# Patient Record
Sex: Male | Born: 1960 | Race: White | Hispanic: No | Smoking: Former smoker
Health system: Southern US, Academic
[De-identification: ages and names within clinical notes are randomized; demographics above are authoritative.]

## PROBLEM LIST (undated history)

## (undated) DIAGNOSIS — N411 Chronic prostatitis: Secondary | ICD-10-CM

## (undated) DIAGNOSIS — E673 Hypervitaminosis D: Secondary | ICD-10-CM

## (undated) DIAGNOSIS — M549 Dorsalgia, unspecified: Secondary | ICD-10-CM

## (undated) DIAGNOSIS — F32A Depression, unspecified: Secondary | ICD-10-CM

## (undated) DIAGNOSIS — F419 Anxiety disorder, unspecified: Secondary | ICD-10-CM

## (undated) DIAGNOSIS — E538 Deficiency of other specified B group vitamins: Secondary | ICD-10-CM

## (undated) DIAGNOSIS — N183 Chronic kidney disease, stage 3 unspecified: Secondary | ICD-10-CM

## (undated) DIAGNOSIS — G8929 Other chronic pain: Secondary | ICD-10-CM

## (undated) DIAGNOSIS — I2510 Atherosclerotic heart disease of native coronary artery without angina pectoris: Secondary | ICD-10-CM

## (undated) DIAGNOSIS — N289 Disorder of kidney and ureter, unspecified: Secondary | ICD-10-CM

## (undated) DIAGNOSIS — I10 Essential (primary) hypertension: Secondary | ICD-10-CM

## (undated) DIAGNOSIS — K219 Gastro-esophageal reflux disease without esophagitis: Secondary | ICD-10-CM

## (undated) DIAGNOSIS — Z720 Tobacco use: Secondary | ICD-10-CM

## (undated) DIAGNOSIS — E785 Hyperlipidemia, unspecified: Secondary | ICD-10-CM

## (undated) DIAGNOSIS — Z87442 Personal history of urinary calculi: Secondary | ICD-10-CM

## (undated) DIAGNOSIS — M1990 Unspecified osteoarthritis, unspecified site: Secondary | ICD-10-CM

## (undated) DIAGNOSIS — I219 Acute myocardial infarction, unspecified: Secondary | ICD-10-CM

## (undated) HISTORY — PX: COLONOSCOPY: SHX174

## (undated) HISTORY — PX: CHOLECYSTECTOMY: SHX55

## (undated) HISTORY — PX: MENISCUS REPAIR: SHX5179

## (undated) HISTORY — PX: CARDIAC CATHETERIZATION: SHX172

## (undated) HISTORY — PX: HX LAP CHOLECYSTECTOMY: SHX56

## (undated) HISTORY — DX: Anxiety disorder, unspecified: F41.9

## (undated) HISTORY — DX: Chronic prostatitis: N41.1

## (undated) HISTORY — PX: HX CORONARY ARTERY BYPASS GRAFT: SHX141

## (undated) HISTORY — PX: HX KNEE SURGERY: 2100001320

## (undated) HISTORY — DX: Atherosclerotic heart disease of native coronary artery without angina pectoris: I25.10

## (undated) HISTORY — DX: Chronic kidney disease, stage 3 unspecified: N18.30

## (undated) HISTORY — DX: Tobacco use: Z72.0

## (undated) HISTORY — DX: Hypervitaminosis D: E67.3

## (undated) HISTORY — DX: Deficiency of other specified B group vitamins: E53.8

## (undated) HISTORY — DX: Dorsalgia, unspecified: M54.9

## (undated) HISTORY — PX: NECK SURGERY: SHX720

## (undated) HISTORY — DX: Hyperlipidemia, unspecified: E78.5

## (undated) HISTORY — DX: Depression, unspecified: F32.A

## (undated) HISTORY — DX: Essential (primary) hypertension: I10

## (undated) HISTORY — DX: Other chronic pain: G89.29

---

## 1991-06-20 DIAGNOSIS — F411 Generalized anxiety disorder: Secondary | ICD-10-CM | POA: Insufficient documentation

## 1995-06-20 HISTORY — PX: CORONARY ARTERY BYPASS GRAFT: SHX141

## 1997-09-07 ENCOUNTER — Other Ambulatory Visit (HOSPITAL_COMMUNITY): Payer: Self-pay | Admitting: Family Medicine

## 2012-06-19 HISTORY — PX: CARDIAC CATHETERIZATION: SHX172

## 2017-07-31 ENCOUNTER — Other Ambulatory Visit: Payer: Self-pay | Admitting: Neurological Surgery

## 2017-08-17 ENCOUNTER — Encounter (HOSPITAL_COMMUNITY)
Admission: RE | Admit: 2017-08-17 | Discharge: 2017-08-17 | Disposition: A | Discharge status: Home / Self Care | Payer: 59 | Source: Ambulatory Visit | Attending: Neurological Surgery | Admitting: Neurological Surgery

## 2017-08-17 ENCOUNTER — Other Ambulatory Visit (HOSPITAL_COMMUNITY): Payer: Self-pay | Admitting: *Deleted

## 2017-08-17 ENCOUNTER — Other Ambulatory Visit: Payer: Self-pay

## 2017-08-17 ENCOUNTER — Encounter (HOSPITAL_COMMUNITY): Payer: Self-pay

## 2017-08-17 DIAGNOSIS — Z7982 Long term (current) use of aspirin: Secondary | ICD-10-CM | POA: Insufficient documentation

## 2017-08-17 DIAGNOSIS — I2510 Atherosclerotic heart disease of native coronary artery without angina pectoris: Secondary | ICD-10-CM | POA: Insufficient documentation

## 2017-08-17 DIAGNOSIS — I10 Essential (primary) hypertension: Secondary | ICD-10-CM | POA: Diagnosis not present

## 2017-08-17 DIAGNOSIS — Z79899 Other long term (current) drug therapy: Secondary | ICD-10-CM | POA: Insufficient documentation

## 2017-08-17 DIAGNOSIS — Z01812 Encounter for preprocedural laboratory examination: Secondary | ICD-10-CM | POA: Diagnosis not present

## 2017-08-17 DIAGNOSIS — F17200 Nicotine dependence, unspecified, uncomplicated: Secondary | ICD-10-CM | POA: Insufficient documentation

## 2017-08-17 DIAGNOSIS — K219 Gastro-esophageal reflux disease without esophagitis: Secondary | ICD-10-CM | POA: Insufficient documentation

## 2017-08-17 HISTORY — DX: Anxiety disorder, unspecified: F41.9

## 2017-08-17 HISTORY — DX: Acute myocardial infarction, unspecified: I21.9

## 2017-08-17 HISTORY — DX: Unspecified osteoarthritis, unspecified site: M19.90

## 2017-08-17 HISTORY — DX: Atherosclerotic heart disease of native coronary artery without angina pectoris: I25.10

## 2017-08-17 HISTORY — DX: Personal history of urinary calculi: Z87.442

## 2017-08-17 HISTORY — DX: Essential (primary) hypertension: I10

## 2017-08-17 HISTORY — DX: Gastro-esophageal reflux disease without esophagitis: K21.9

## 2017-08-17 LAB — CBC WITH DIFFERENTIAL/PLATELET
Basophils Absolute: 0 10*3/uL (ref 0.0–0.1)
Basophils Relative: 0 %
EOS PCT: 1 %
Eosinophils Absolute: 0.1 10*3/uL (ref 0.0–0.7)
HCT: 45.2 % (ref 39.0–52.0)
Hemoglobin: 15.9 g/dL (ref 13.0–17.0)
LYMPHS ABS: 1.8 10*3/uL (ref 0.7–4.0)
Lymphocytes Relative: 21 %
MCH: 32.4 pg (ref 26.0–34.0)
MCHC: 35.2 g/dL (ref 30.0–36.0)
MCV: 92.1 fL (ref 78.0–100.0)
MONO ABS: 0.6 10*3/uL (ref 0.1–1.0)
MONOS PCT: 8 %
Neutro Abs: 5.7 10*3/uL (ref 1.7–7.7)
Neutrophils Relative %: 70 %
PLATELETS: 184 10*3/uL (ref 150–400)
RBC: 4.91 MIL/uL (ref 4.22–5.81)
RDW: 12.8 % (ref 11.5–15.5)
WBC: 8.2 10*3/uL (ref 4.0–10.5)

## 2017-08-17 LAB — SURGICAL PCR SCREEN
MRSA, PCR: NEGATIVE
Staphylococcus aureus: NEGATIVE

## 2017-08-17 LAB — BASIC METABOLIC PANEL
Anion gap: 10 (ref 5–15)
BUN: 16 mg/dL (ref 6–20)
CHLORIDE: 104 mmol/L (ref 101–111)
CO2: 22 mmol/L (ref 22–32)
CREATININE: 1.28 mg/dL — AB (ref 0.61–1.24)
Calcium: 9.3 mg/dL (ref 8.9–10.3)
GFR calc Af Amer: >60 mL/min (ref >60)
GFR calc non Af Amer: >60 mL/min (ref >60)
GLUCOSE: 91 mg/dL (ref 65–99)
Potassium: 4.2 mmol/L (ref 3.5–5.1)
SODIUM: 136 mmol/L (ref 135–145)

## 2017-08-17 LAB — PROTIME-INR
INR: 1.06
Prothrombin Time: 13.7 seconds (ref 11.4–15.2)

## 2017-08-17 NOTE — Pre-Procedure Instructions (Addendum)
Maple HudsonDevon Donald Lavine  08/17/2017    Your procedure is scheduled on Friday, August 24, 2017 at 9:45 AM.   [facility] to [facility] "A" Admitting Office at 7:45 AM.   Call this number if you have problems the morning of surgery: [phone]   Questions prior to day of surgery, please call [phone] between 8 & 4 PM.   Remember:  Do not eat food or drink liquids after midnight Thursday, 08/23/17.  Take these medicines the morning of surgery with A SIP OF WATER: Aspirin, Alprazolam (Xanax), Amlodipine (Norvasc), Citalopram (Celexa), Esomeprazole (Nexium), Tramadol - if needed  Stop Plavix 5 days prior to surgery per cardiologist's instructions. Continue Aspirin as usual.  Stop NSAIDS (Ibuprofen, Aleve, etc) 5 days prior to surgery.   Do not wear jewelry.  Do not wear lotions, powders, cologne or deodorant.  Men may shave face and neck.  Do not bring valuables to the hospital.  [facility] is not responsible for any belongings or valuables.  Contacts, dentures or bridgework may not be worn into surgery.  Leave your suitcase in the car.  After surgery it may be brought to your room.  For patients admitted to the hospital, discharge time will be determined by your treatment team.  [facility] - Preparing for Surgery  Before surgery, you can play an important role.  Because skin is not sterile, your skin needs to be as free of germs as possible.  You can reduce the number of germs on you skin by washing with CHG (chlorahexidine gluconate) soap before surgery.  CHG is an antiseptic cleaner which kills germs and bonds with the skin to continue killing germs even after washing.  Please DO NOT use if you have an allergy to CHG or antibacterial soaps.  If your skin becomes reddened/irritated stop using the CHG and inform your nurse when you arrive at Short Stay.  Do not shave (including legs and underarms) for at least 48 hours prior to the first CHG shower.  You may shave your  face.  Please follow these instructions carefully:   1.  Shower with CHG Soap the night before surgery and the                    morning of Surgery.  2.  If you choose to wash your hair, wash your hair first as usual with your       normal shampoo.  3.  After you shampoo, rinse your hair and body thoroughly to remove the shampoo.  4.  Use CHG as you would any other liquid soap.  You can apply chg directly       to the skin and wash gently with scrungie or a clean washcloth.  5.  Apply the CHG Soap to your body ONLY FROM THE NECK DOWN.        Do not use on open wounds or open sores.  Avoid contact with your eyes, ears, mouth and genitals (private parts).  Wash genitals (private parts) with your normal soap.  6.  Wash thoroughly, paying special attention to the area where your surgery        will be performed.  7.  Thoroughly rinse your body with warm water from the neck down.  8.  DO NOT shower/wash with your normal soap after using and rinsing off       the CHG Soap.  9.  Pat yourself dry with a clean towel.  10.  Wear clean pajamas.            11.  Place clean sheets on your bed the night of your first shower and do not        sleep with pets.  Day of Surgery  Do not apply any lotions/deodorants the morning of surgery.  Please wear clean clothes to the hospital.   Please read over the fact sheets that you were given.

## 2017-08-17 NOTE — Progress Notes (Signed)
Pt has hx of CAD with CABG  x 4 in 1997, MI 3 years ago. Pt is on Aspirin and Plavix. Pt states his cardiologist is Dr. [place]Stephen [place]. Last office visit notes, EKG, Stress test, Echo all requested from Dr. Jolyn NapWard's office. Pt denies any recent chest pain or sob. Pt states he had a [facility] and [facility] in [place] a few months ago, have requested records be sent.  Pt states he is not diabetic.  Dr. Yetta BarreJones' office has gotten instructions from pt's PCP to stop Aspirin and Plavix 5 days prior to surgery. Cardiologist has ok'ed that also per Erie NoeVanessa at Dr. Yetta BarreJones' office. Medical clearance in chart.

## 2017-08-20 NOTE — Progress Notes (Addendum)
Anesthesia Chart Review:  Pt is a 57 year old male scheduled for C4-5, C5-6, C6-7 ACDF on 08/24/2017 with Marikay Alaravid Jones, MD  - PCP is Luisa DagoStephen Degray, [place] in [place] - Cardiologist is Elam DutchStephen Ward, [place] in [place] Last office visit 07/05/17 with Cathey EndowEmilee Huffman, NP who is aware of upcoming surgery.   PMH includes:  CAD (s/p CABG 1997), HTN, GERD. Current smoker. BMI 26  Medications include: Amlodipine, ASA 81 mg, Plavix, Nexium, lisinopril, simvastatin. Pt to hold ASA and plavix 5 days before surgery.   BP 123/71   Pulse 87   Temp 36.7 C   Resp 20   Ht 6' (1.829 m)   Wt 189 lb 9.6 oz (86 kg)   SpO2 99%   BMI 25.71 kg/m   Preoperative labs reviewed.    CXR 06/16/17 [facility] [place], ). 1.  COPD and interstitial lung thickening. 2.  Mild pulmonary congestion. 3.  No definite acute cardiopulmonary disease. 4.  Allowing for technical variations in time interval there is no significant change from prior study.  EKG 07/05/17 (at Dr. Elesa MassedWard' office): NSR  Nuclear stress test 08/09/16 [facility] in [place], [place] 1.  This is a low risk study.  There was a moderate sized apical perfusion defect that was largely fixed, but did show mild reversibility.   If no changes, I anticipate pt can proceed with surgery as scheduled.   Rica Mastngela Kabbe, FNP-BC [facility]/Anesthesiology Phone: [phone] 08/21/2017 4:46 PM

## 2017-08-23 NOTE — Progress Notes (Signed)
Rerequested cx[facility] report from [facility].

## 2017-08-24 ENCOUNTER — Inpatient Hospital Stay (HOSPITAL_COMMUNITY): Payer: 59 | Admitting: Emergency Medicine

## 2017-08-24 ENCOUNTER — Inpatient Hospital Stay (HOSPITAL_COMMUNITY): Payer: 59

## 2017-08-24 ENCOUNTER — Encounter (HOSPITAL_COMMUNITY)
Admission: RE | Disposition: A | Discharge status: Home / Self Care | Payer: Self-pay | Source: Ambulatory Visit | Attending: Neurological Surgery

## 2017-08-24 ENCOUNTER — Observation Stay (HOSPITAL_COMMUNITY)
Admission: RE | Admit: 2017-08-24 | Discharge: 2017-08-25 | Disposition: A | Discharge status: Home / Self Care | Payer: 59 | Source: Ambulatory Visit | Attending: Neurological Surgery | Admitting: Neurological Surgery

## 2017-08-24 ENCOUNTER — Encounter (HOSPITAL_COMMUNITY): Payer: Self-pay | Admitting: *Deleted

## 2017-08-24 DIAGNOSIS — I10 Essential (primary) hypertension: Secondary | ICD-10-CM | POA: Diagnosis not present

## 2017-08-24 DIAGNOSIS — I2510 Atherosclerotic heart disease of native coronary artery without angina pectoris: Secondary | ICD-10-CM | POA: Diagnosis not present

## 2017-08-24 DIAGNOSIS — M50123 Cervical disc disorder at C6-C7 level with radiculopathy: Secondary | ICD-10-CM | POA: Insufficient documentation

## 2017-08-24 DIAGNOSIS — Z951 Presence of aortocoronary bypass graft: Secondary | ICD-10-CM | POA: Insufficient documentation

## 2017-08-24 DIAGNOSIS — F17210 Nicotine dependence, cigarettes, uncomplicated: Secondary | ICD-10-CM | POA: Insufficient documentation

## 2017-08-24 DIAGNOSIS — M4802 Spinal stenosis, cervical region: Secondary | ICD-10-CM | POA: Diagnosis not present

## 2017-08-24 DIAGNOSIS — Z79899 Other long term (current) drug therapy: Secondary | ICD-10-CM | POA: Insufficient documentation

## 2017-08-24 DIAGNOSIS — Z419 Encounter for procedure for purposes other than remedying health state, unspecified: Secondary | ICD-10-CM

## 2017-08-24 DIAGNOSIS — Z7982 Long term (current) use of aspirin: Secondary | ICD-10-CM | POA: Insufficient documentation

## 2017-08-24 DIAGNOSIS — I252 Old myocardial infarction: Secondary | ICD-10-CM | POA: Insufficient documentation

## 2017-08-24 DIAGNOSIS — F419 Anxiety disorder, unspecified: Secondary | ICD-10-CM | POA: Insufficient documentation

## 2017-08-24 DIAGNOSIS — M4322 Fusion of spine, cervical region: Secondary | ICD-10-CM | POA: Diagnosis present

## 2017-08-24 HISTORY — PX: ANTERIOR CERVICAL DECOMP/DISCECTOMY FUSION: SHX1161

## 2017-08-24 SURGERY — ANTERIOR CERVICAL DECOMPRESSION/DISCECTOMY FUSION 3 LEVELS
Anesthesia: General | Site: Spine Cervical

## 2017-08-24 MED ORDER — EPHEDRINE SULFATE 50 MG/ML IJ SOLN
INTRAMUSCULAR | Status: DC | PRN
  Administered 2017-08-24 (×3): 10 mg via INTRAVENOUS

## 2017-08-24 MED ORDER — PROMETHAZINE HCL 25 MG/ML IJ SOLN: 6.2500 mg | INTRAMUSCULAR | Status: DC | PRN

## 2017-08-24 MED ORDER — AMLODIPINE BESYLATE 5 MG PO TABS: 2.5000 mg | ORAL_TABLET | Freq: Every day | ORAL | Status: DC

## 2017-08-24 MED ORDER — ALPRAZOLAM 0.5 MG PO TABS
0.5000 mg | ORAL_TABLET | Freq: Two times a day (BID) | ORAL | Status: DC
  Administered 2017-08-24: 0.5 mg via ORAL
  Filled 2017-08-24: qty 1

## 2017-08-24 MED ORDER — MENTHOL 3 MG MT LOZG
1.0000 | LOZENGE | OROMUCOSAL | Status: DC | PRN
  Filled 2017-08-24 (×2): qty 9

## 2017-08-24 MED ORDER — SODIUM CHLORIDE 0.9% FLUSH
3.0000 mL | Freq: Two times a day (BID) | INTRAVENOUS | Status: DC
  Administered 2017-08-24 (×2): 3 mL via INTRAVENOUS

## 2017-08-24 MED ORDER — PANTOPRAZOLE SODIUM 40 MG PO TBEC: 40.0000 mg | DELAYED_RELEASE_TABLET | Freq: Every day | ORAL | Status: DC

## 2017-08-24 MED ORDER — CEFAZOLIN SODIUM-DEXTROSE 2-4 GM/100ML-% IV SOLN
INTRAVENOUS | Status: AC
  Filled 2017-08-24: qty 100

## 2017-08-24 MED ORDER — ONDANSETRON HCL 4 MG/2ML IJ SOLN
INTRAMUSCULAR | Status: AC
  Filled 2017-08-24: qty 2

## 2017-08-24 MED ORDER — ACETAMINOPHEN 650 MG RE SUPP: 650.0000 mg | RECTAL | Status: DC | PRN

## 2017-08-24 MED ORDER — CITALOPRAM HYDROBROMIDE 20 MG PO TABS
20.0000 mg | ORAL_TABLET | Freq: Every day | ORAL | Status: DC
  Filled 2017-08-24 (×2): qty 1

## 2017-08-24 MED ORDER — SUGAMMADEX SODIUM 200 MG/2ML IV SOLN
INTRAVENOUS | Status: DC | PRN
  Administered 2017-08-24: 180 mg via INTRAVENOUS

## 2017-08-24 MED ORDER — ONDANSETRON HCL 4 MG PO TABS: 4.0000 mg | ORAL_TABLET | Freq: Four times a day (QID) | ORAL | Status: DC | PRN

## 2017-08-24 MED ORDER — ONDANSETRON HCL 4 MG/2ML IJ SOLN
INTRAMUSCULAR | Status: DC | PRN
  Administered 2017-08-24: 4 mg via INTRAVENOUS

## 2017-08-24 MED ORDER — HEMOSTATIC AGENTS (NO CHARGE) OPTIME
TOPICAL | Status: DC | PRN
  Administered 2017-08-24: 1 via TOPICAL

## 2017-08-24 MED ORDER — SODIUM CHLORIDE 0.9% FLUSH: 3.0000 mL | INTRAVENOUS | Status: DC | PRN

## 2017-08-24 MED ORDER — FENTANYL CITRATE (PF) 100 MCG/2ML IJ SOLN
INTRAMUSCULAR | Status: DC | PRN
  Administered 2017-08-24 (×3): 50 ug via INTRAVENOUS
  Administered 2017-08-24: 250 ug via INTRAVENOUS

## 2017-08-24 MED ORDER — CHLORHEXIDINE GLUCONATE CLOTH 2 % EX PADS: 6.0000 | MEDICATED_PAD | Freq: Once | CUTANEOUS | Status: DC

## 2017-08-24 MED ORDER — LISINOPRIL 2.5 MG PO TABS
2.5000 mg | ORAL_TABLET | Freq: Every day | ORAL | Status: DC
  Filled 2017-08-24 (×2): qty 1

## 2017-08-24 MED ORDER — SUGAMMADEX SODIUM 200 MG/2ML IV SOLN
INTRAVENOUS | Status: AC
  Filled 2017-08-24: qty 2

## 2017-08-24 MED ORDER — ALBUTEROL SULFATE HFA 108 (90 BASE) MCG/ACT IN AERS
INHALATION_SPRAY | RESPIRATORY_TRACT | Status: DC | PRN
  Administered 2017-08-24 (×3): 2 via RESPIRATORY_TRACT

## 2017-08-24 MED ORDER — MEPERIDINE HCL 50 MG/ML IJ SOLN: 6.2500 mg | INTRAMUSCULAR | Status: DC | PRN

## 2017-08-24 MED ORDER — ROCURONIUM BROMIDE 100 MG/10ML IV SOLN
INTRAVENOUS | Status: DC | PRN
  Administered 2017-08-24: 50 mg via INTRAVENOUS

## 2017-08-24 MED ORDER — MIDAZOLAM HCL 2 MG/2ML IJ SOLN: 0.5000 mg | Freq: Once | INTRAMUSCULAR | Status: DC | PRN

## 2017-08-24 MED ORDER — METHOCARBAMOL 500 MG PO TABS
500.0000 mg | ORAL_TABLET | Freq: Four times a day (QID) | ORAL | Status: DC | PRN
  Administered 2017-08-24 – 2017-08-25 (×3): 500 mg via ORAL
  Filled 2017-08-24 (×4): qty 1

## 2017-08-24 MED ORDER — OXYCODONE HCL 5 MG PO TABS
5.0000 mg | ORAL_TABLET | ORAL | Status: DC | PRN
  Administered 2017-08-24 – 2017-08-25 (×4): 5 mg via ORAL
  Filled 2017-08-24 (×4): qty 1

## 2017-08-24 MED ORDER — MIDAZOLAM HCL 2 MG/2ML IJ SOLN
INTRAMUSCULAR | Status: AC
  Filled 2017-08-24: qty 2

## 2017-08-24 MED ORDER — 0.9 % SODIUM CHLORIDE (POUR BTL) OPTIME
TOPICAL | Status: DC | PRN
  Administered 2017-08-24: 1000 mL

## 2017-08-24 MED ORDER — CEFAZOLIN SODIUM-DEXTROSE 2-4 GM/100ML-% IV SOLN
2.0000 g | Freq: Three times a day (TID) | INTRAVENOUS | Status: AC
  Administered 2017-08-24 (×2): 2 g via INTRAVENOUS
  Filled 2017-08-24 (×2): qty 100

## 2017-08-24 MED ORDER — DEXAMETHASONE SODIUM PHOSPHATE 10 MG/ML IJ SOLN
10.0000 mg | INTRAMUSCULAR | Status: AC
  Administered 2017-08-24: 10 mg via INTRAVENOUS
  Filled 2017-08-24: qty 1

## 2017-08-24 MED ORDER — METHOCARBAMOL 1000 MG/10ML IJ SOLN: 500.0000 mg | Freq: Four times a day (QID) | INTRAMUSCULAR | Status: DC | PRN

## 2017-08-24 MED ORDER — POTASSIUM CHLORIDE IN NACL 20-0.9 MEQ/L-% IV SOLN: INTRAVENOUS | Status: DC

## 2017-08-24 MED ORDER — ACETAMINOPHEN 325 MG PO TABS: 650.0000 mg | ORAL_TABLET | ORAL | Status: DC | PRN

## 2017-08-24 MED ORDER — LACTATED RINGERS IV SOLN
INTRAVENOUS | Status: DC
  Administered 2017-08-24 (×2): via INTRAVENOUS

## 2017-08-24 MED ORDER — BUPIVACAINE HCL (PF) 0.25 % IJ SOLN
INTRAMUSCULAR | Status: AC
  Filled 2017-08-24: qty 30

## 2017-08-24 MED ORDER — FENTANYL CITRATE (PF) 250 MCG/5ML IJ SOLN
INTRAMUSCULAR | Status: AC
  Filled 2017-08-24: qty 5

## 2017-08-24 MED ORDER — HYDROMORPHONE HCL 1 MG/ML IJ SOLN
0.2500 mg | INTRAMUSCULAR | Status: DC | PRN
Start: 2017-08-24 — End: 2017-08-24

## 2017-08-24 MED ORDER — ALBUTEROL SULFATE HFA 108 (90 BASE) MCG/ACT IN AERS
INHALATION_SPRAY | RESPIRATORY_TRACT | Status: AC
  Filled 2017-08-24: qty 6.7

## 2017-08-24 MED ORDER — THROMBIN 5000 UNITS EX SOLR
CUTANEOUS | Status: AC
  Filled 2017-08-24: qty 5000

## 2017-08-24 MED ORDER — PHENOL 1.4 % MT LIQD: 1.0000 | OROMUCOSAL | Status: DC | PRN

## 2017-08-24 MED ORDER — MIDAZOLAM HCL 5 MG/5ML IJ SOLN
INTRAMUSCULAR | Status: DC | PRN
  Administered 2017-08-24: 2 mg via INTRAVENOUS

## 2017-08-24 MED ORDER — ONDANSETRON HCL 4 MG/2ML IJ SOLN: 4.0000 mg | Freq: Four times a day (QID) | INTRAMUSCULAR | Status: DC | PRN

## 2017-08-24 MED ORDER — GELATIN ABSORBABLE MT POWD
OROMUCOSAL | Status: DC | PRN
  Administered 2017-08-24 (×2): 5 mL via TOPICAL

## 2017-08-24 MED ORDER — HYDROMORPHONE HCL 1 MG/ML IJ SOLN: 0.5000 mg | INTRAMUSCULAR | Status: DC | PRN

## 2017-08-24 MED ORDER — SODIUM CHLORIDE 0.9 % IR SOLN
Status: DC | PRN
  Administered 2017-08-24: 500 mL

## 2017-08-24 MED ORDER — LIDOCAINE HCL (CARDIAC) 20 MG/ML IV SOLN
INTRAVENOUS | Status: DC | PRN
  Administered 2017-08-24: 25 mg via INTRAVENOUS

## 2017-08-24 MED ORDER — CEFAZOLIN SODIUM-DEXTROSE 2-4 GM/100ML-% IV SOLN
2.0000 g | INTRAVENOUS | Status: AC
  Administered 2017-08-24: 2 g via INTRAVENOUS

## 2017-08-24 MED ORDER — PROPOFOL 10 MG/ML IV BOLUS
INTRAVENOUS | Status: DC | PRN
  Administered 2017-08-24: 200 mg via INTRAVENOUS

## 2017-08-24 MED ORDER — SENNA 8.6 MG PO TABS
1.0000 | ORAL_TABLET | Freq: Two times a day (BID) | ORAL | Status: DC
  Filled 2017-08-24: qty 1

## 2017-08-24 MED ORDER — BUPIVACAINE HCL (PF) 0.25 % IJ SOLN
INTRAMUSCULAR | Status: DC | PRN
  Administered 2017-08-24: 5 mL

## 2017-08-24 SURGICAL SUPPLY — 60 items
BAG DECANTER FOR FLEXI CONT (MISCELLANEOUS) ×2 IMPLANT
BASKET BONE COLLECTION (BASKET) ×2 IMPLANT
BENZOIN TINCTURE PRP APPL 2/3 (GAUZE/BANDAGES/DRESSINGS) ×2 IMPLANT
BIT DRILL 13 (BIT) ×4 IMPLANT
BUR MATCHSTICK NEURO 3.0 LAGG (BURR) ×2 IMPLANT
CANISTER SUCT 3000ML PPV (MISCELLANEOUS) ×2 IMPLANT
CARTRIDGE OIL MAESTRO DRILL (MISCELLANEOUS) ×1 IMPLANT
DERMABOND ADVANCED (GAUZE/BANDAGES/DRESSINGS) ×1
DERMABOND ADVANCED .7 DNX12 (GAUZE/BANDAGES/DRESSINGS) ×1 IMPLANT
DIFFUSER DRILL AIR PNEUMATIC (MISCELLANEOUS) ×2 IMPLANT
DRAPE C-ARM 42X72 X-RAY (DRAPES) ×4 IMPLANT
DRAPE LAPAROTOMY 100X72 PEDS (DRAPES) ×2 IMPLANT
DRAPE MICROSCOPE LEICA (MISCELLANEOUS) ×2 IMPLANT
DRAPE POUCH INSTRU U-SHP 10X18 (DRAPES) ×2 IMPLANT
DRSG OPSITE POSTOP 3X4 (GAUZE/BANDAGES/DRESSINGS) ×2 IMPLANT
DURAPREP 6ML APPLICATOR 50/CS (WOUND CARE) ×2 IMPLANT
ELECT COATED BLADE 2.86 ST (ELECTRODE) ×2 IMPLANT
ELECT REM PT RETURN 9FT ADLT (ELECTROSURGICAL) ×2
ELECTRODE REM PT RTRN 9FT ADLT (ELECTROSURGICAL) ×1 IMPLANT
GAUZE SPONGE 4X4 16PLY XRAY LF (GAUZE/BANDAGES/DRESSINGS) ×2 IMPLANT
GLOVE BIO SURGEON STRL SZ7 (GLOVE) ×2 IMPLANT
GLOVE BIO SURGEON STRL SZ8 (GLOVE) ×2 IMPLANT
GLOVE BIOGEL PI IND STRL 6.5 (GLOVE) ×1 IMPLANT
GLOVE BIOGEL PI IND STRL 7.0 (GLOVE) ×1 IMPLANT
GLOVE BIOGEL PI IND STRL 7.5 (GLOVE) ×1 IMPLANT
GLOVE BIOGEL PI INDICATOR 6.5 (GLOVE) ×1
GLOVE BIOGEL PI INDICATOR 7.0 (GLOVE) ×1
GLOVE BIOGEL PI INDICATOR 7.5 (GLOVE) ×1
GLOVE SS BIOGEL STRL SZ 7 (GLOVE) ×1 IMPLANT
GLOVE SUPERSENSE BIOGEL SZ 7 (GLOVE) ×1
GLOVE SURG SS PI 6.0 STRL IVOR (GLOVE) ×4 IMPLANT
GOWN STRL REUS W/ TWL LRG LVL3 (GOWN DISPOSABLE) ×3 IMPLANT
GOWN STRL REUS W/ TWL XL LVL3 (GOWN DISPOSABLE) ×1 IMPLANT
GOWN STRL REUS W/TWL 2XL LVL3 (GOWN DISPOSABLE) IMPLANT
GOWN STRL REUS W/TWL LRG LVL3 (GOWN DISPOSABLE) ×3
GOWN STRL REUS W/TWL XL LVL3 (GOWN DISPOSABLE) ×1
HEMOSTAT POWDER KIT SURGIFOAM (HEMOSTASIS) ×4 IMPLANT
KIT BASIN OR (CUSTOM PROCEDURE TRAY) ×2 IMPLANT
KIT ROOM TURNOVER OR (KITS) ×2 IMPLANT
NEEDLE HYPO 25X1 1.5 SAFETY (NEEDLE) ×2 IMPLANT
NEEDLE SPNL 20GX3.5 QUINCKE YW (NEEDLE) ×2 IMPLANT
NS IRRIG 1000ML POUR BTL (IV SOLUTION) ×2 IMPLANT
OIL CARTRIDGE MAESTRO DRILL (MISCELLANEOUS) ×2
PACK LAMINECTOMY NEURO (CUSTOM PROCEDURE TRAY) ×2 IMPLANT
PAD ARMBOARD 7.5X6 YLW CONV (MISCELLANEOUS) ×6 IMPLANT
PIN DISTRACTION 14MM (PIN) ×4 IMPLANT
PLATE 3 62.5XLCK NS SPNE CVD (Plate) ×1 IMPLANT
PLATE 3 ATLANTIS TRANS (Plate) ×1 IMPLANT
RUBBERBAND STERILE (MISCELLANEOUS) ×4 IMPLANT
SCREW FIXED ATLANTIS 4X12MM (Screw) ×2 IMPLANT
SCREW ST 14X4XST FXANG SPNE (Screw) ×8 IMPLANT
SCREW ST FIX 4 ATL (Screw) ×8 IMPLANT
SPACER PTI-C 8X16X14 7DEG (Spacer) ×6 IMPLANT
SPONGE INTESTINAL PEANUT (DISPOSABLE) ×2 IMPLANT
SPONGE SURGIFOAM ABS GEL 100 (HEMOSTASIS) ×2 IMPLANT
STRIP CLOSURE SKIN 1/2X4 (GAUZE/BANDAGES/DRESSINGS) ×2 IMPLANT
SUT VIC AB 3-0 SH 8-18 (SUTURE) ×4 IMPLANT
TOWEL GREEN STERILE (TOWEL DISPOSABLE) ×2 IMPLANT
TOWEL GREEN STERILE FF (TOWEL DISPOSABLE) ×2 IMPLANT
WATER STERILE IRR 1000ML POUR (IV SOLUTION) ×2 IMPLANT

## 2017-08-24 NOTE — Anesthesia Preprocedure Evaluation (Addendum)
Anesthesia Evaluation  Patient identified by MRN, date of birth, ID band Patient awake    Reviewed: Allergy & Precautions, NPO status , Patient's Chart, lab work & pertinent test results  History of Anesthesia Complications Negative for: history of anesthetic complications  Airway Mallampati: II  TM Distance: >3 FB Neck ROM: Full    Dental  (+) Caps, Dental Advisory Given   Pulmonary Current Smoker,    breath sounds clear to auscultation       Cardiovascular hypertension, Pt. on medications + CAD and + CABG  (-) Past MI  Rhythm:Regular Rate:Normal  Nuclear stress test 08/09/16 [facility] in [place], [place] This is a low risk study.  There was a moderate sized apical perfusion defect that was largely fixed, but did show mild reversibility.     Neuro/Psych Neck pain with arm numbness    GI/Hepatic Neg liver ROS, GERD  Medicated and Controlled,  Endo/Other  negative endocrine ROS  Renal/GU Renal InsufficiencyRenal disease (creat 1.28)     Musculoskeletal   Abdominal   Peds  Hematology negative hematology ROS (+)   Anesthesia Other Findings   Reproductive/Obstetrics                          Anesthesia Physical Anesthesia Plan  ASA: III  Anesthesia Plan: General   Post-op Pain Management:    Induction: Intravenous  PONV Risk Score and Plan: 2 and Ondansetron and Dexamethasone  Airway Management Planned: Oral ETT and Video Laryngoscope Planned  Additional Equipment:   Intra-op Plan:   Post-operative Plan: Extubation in OR  Informed Consent: I have reviewed the patients History and Physical, chart, labs and discussed the procedure including the risks, benefits and alternatives for the proposed anesthesia with the patient or authorized representative who has indicated his/her understanding and acceptance.   Dental advisory given  Plan Discussed with: CRNA and  Surgeon  Anesthesia Plan Comments: (Plan routine monitors, GETA with VideoGlide intubation)        Anesthesia Quick Evaluation

## 2017-08-24 NOTE — Progress Notes (Signed)
Patient ID: Keith HudsonDevon Donald Owen, male   DOB: Jun 11, 1961, 57 y.o.   MRN: [id number] Doing great postop. Some neck soreness. No arm pain. Good strength.

## 2017-08-24 NOTE — H&P (Signed)
Subjective:   Patient is a 57 y.o. male admitted for neck pain. The patient first presented to me with complaints of neck pain, shooting pains in the arm(s) and numbness of the arm(s). Onset of symptoms was several months ago. The pain is described as aching and occurs all day. The pain is rated severe, and is located in the neck and radiates to the arms. The symptoms have been progressive. Symptoms are exacerbated by extending head backwards, and are relieved by none.  Previous work up includes MRI of cervical spine, results: spinal stenosis.  Past Medical History:  Diagnosis Date  . Anxiety   . Arthritis   . Coronary artery disease   . GERD (gastroesophageal reflux disease)   . History of kidney stones   . Hypertension   . Myocardial infarction [facility])     Past Surgical History:  Procedure Laterality Date  . CARDIAC CATHETERIZATION    . CHOLECYSTECTOMY    . COLONOSCOPY    . CORONARY ARTERY BYPASS GRAFT  1997   age 63   4 bypasses  . MENISCUS REPAIR Left     No Known Allergies  Social History   Tobacco Use  . Smoking status: Current Every Day Smoker    Packs/day: 1.00    Types: Cigarettes  . Smokeless tobacco: Never Used  Substance Use Topics  . Alcohol use: Yes    Comment: occasional    Family History  Problem Relation Age of Onset  . Cancer Mother   . Heart disease Father   . Valvular heart disease Father    Prior to Admission medications   Medication Sig Start Date End Date Taking? Authorizing Provider  ALPRAZolam Prudy Feeler) 0.5 MG tablet Take 0.5 mg by mouth 2 (two) times daily.   Yes [provider]  amLODipine (NORVASC) 2.5 MG tablet Take 2.5 mg by mouth daily.   Yes [provider]  aspirin EC 81 MG tablet Take 81 mg by mouth daily.   Yes [provider]  citalopram (CELEXA) 20 MG tablet Take 20 mg by mouth daily.   Yes [provider]  clopidogrel (PLAVIX) 75 MG tablet Take 75 mg by mouth daily.   Yes [provider]   esomeprazole (NEXIUM) 20 MG capsule Take 20 mg by mouth daily.   Yes [provider]  ibuprofen (ADVIL,MOTRIN) 200 MG tablet Take 400 mg by mouth every 6 (six) hours as needed for headache or moderate pain.   Yes [provider]  lisinopril (PRINIVIL,ZESTRIL) 2.5 MG tablet Take 2.5 mg by mouth daily.   Yes [provider]  simvastatin (ZOCOR) 20 MG tablet Take 20 mg by mouth daily.   Yes [provider]  traMADol (ULTRAM) 50 MG tablet Take 50 mg by mouth 2 (two) times daily as needed for moderate pain.   Yes [provider]     Review of Systems  Positive ROS: neg  All other systems have been reviewed and were otherwise negative with the exception of those mentioned in the HPI and as above.  Objective: Vital signs in last 24 hours: Temp:  [98.3 F (36.8 C)] 98.3 F (36.8 C) (03/08 0825) Pulse Rate:  [66] 66 (03/08 0825) Resp:  [18] 18 (03/08 0825) BP: (120)/(71) 120/71 (03/08 0825) SpO2:  [96 %] 96 % (03/08 0825) Weight:  [86 kg (189 lb 9.6 oz)] 86 kg (189 lb 9.6 oz) (03/08 0825)  General Appearance: Alert, cooperative, no distress, appears stated age Head: Normocephalic, without obvious abnormality, atraumatic  Eyes: PERRL, conjunctiva/corneas clear, EOM's intact      Neck: Supple, symmetrical, trachea midline, Back: Symmetric, no curvature, ROM normal, no CVA tenderness Lungs:  respirations unlabored Heart: Regular rate and rhythm Abdomen: Soft, non-tender Extremities: Extremities normal, atraumatic, no cyanosis or edema Pulses: 2+ and symmetric all extremities Skin: Skin color, texture, turgor normal, no rashes or lesions  NEUROLOGIC:  Mental status: Alert and oriented x4, no aphasia, good attention span, fund of knowledge and memory  Motor Exam - grossly normal Sensory Exam - grossly normal Reflexes: 1+ Coordination - grossly normal Gait - grossly normal Balance - grossly normal Cranial Nerves: I: smell Not tested  II:  visual acuity  OS: nl    OD: nl  II: visual fields Full to confrontation  II: pupils Equal, round, reactive to light  III,VII: ptosis None  III,IV,VI: extraocular muscles  Full ROM  V: mastication Normal  V: facial light touch sensation  Normal  V,VII: corneal reflex  Present  VII: facial muscle function - upper  Normal  VII: facial muscle function - lower Normal  VIII: hearing Not tested  IX: soft palate elevation  Normal  IX,X: gag reflex Present  XI: trapezius strength  5/5  XI: sternocleidomastoid strength 5/5  XI: neck flexion strength  5/5  XII: tongue strength  Normal    Data Review Lab Results  Component Value Date   WBC 8.2 08/17/2017   HGB 15.9 08/17/2017   HCT 45.2 08/17/2017   MCV 92.1 08/17/2017   PLT 184 08/17/2017   Lab Results  Component Value Date   NA 136 08/17/2017   K 4.2 08/17/2017   CL 104 08/17/2017   CO2 22 08/17/2017   BUN 16 08/17/2017   CREATININE 1.28 (H) 08/17/2017   GLUCOSE 91 08/17/2017   Lab Results  Component Value Date   INR 1.06 08/17/2017    Assessment:   Cervical neck pain with herniated nucleus pulposus/ spondylosis/ stenosis at C4-7. Estimated body mass index is 25.71 kg/m as calculated from the following:   Height as of this encounter: 6' (1.829 m).   Weight as of this encounter: 86 kg (189 lb 9.6 oz).  Patient has failed conservative therapy. Planned surgery : ACDF C4-5 C5-6 C6-7  Plan:   I explained the condition and procedure to the patient and answered any questions.  Patient wishes to proceed with procedure as planned. Understands risks/ benefits/ and expected or typical outcomes.  Keith Owen S 08/24/2017 9:37 AM

## 2017-08-24 NOTE — Anesthesia Postprocedure Evaluation (Signed)
Anesthesia Post Note  Patient: Maple HudsonDevon Donald Maes  Procedure(s) Performed: ANTERIOR CERVICAL DECOMPRESSION FUSION CERVICAL FOUR-FIVE,CERVICAL FIVE-SIX,CERVICAL SIX-SEVEN. (N/A Spine Cervical)     Patient location during evaluation: PACU Anesthesia Type: General Level of consciousness: awake and alert, patient cooperative and oriented Pain management: pain level controlled Vital Signs Assessment: post-procedure vital signs reviewed and stable Respiratory status: spontaneous breathing, nonlabored ventilation and respiratory function stable Cardiovascular status: blood pressure returned to baseline and stable Postop Assessment: no apparent nausea or vomiting Anesthetic complications: no    Last Vitals:  Vitals:   08/24/17 1345 08/24/17 1355  BP: 112/72   Pulse: 69 72  Resp: 20   Temp:  36.9 C  SpO2: 94% 90%    Last Pain:  Vitals:   08/24/17 1345  TempSrc:   PainSc: 0-No pain                 Devynn Scheff,E. Atreyu Mak

## 2017-08-24 NOTE — Anesthesia Procedure Notes (Signed)
Procedure Name: Intubation Date/Time: 08/24/2017 10:11 AM Performed by: Rosiland OzMeyers, Macintyre Alexa, CRNA Pre-anesthesia Checklist: Patient identified, Emergency Drugs available, Suction available, Patient being monitored and Timeout performed Patient Re-evaluated:Patient Re-evaluated prior to induction Oxygen Delivery Method: Circle system utilized Preoxygenation: Pre-oxygenation with 100% oxygen Induction Type: IV induction Ventilation: Mask ventilation without difficulty Laryngoscope Size: Miller and 3 Grade View: Grade I Tube type: Oral Tube size: 7.5 mm Number of attempts: 1 Airway Equipment and Method: Stylet Placement Confirmation: ETT inserted through vocal cords under direct vision,  positive ETCO2 and breath sounds checked- equal and bilateral Secured at: 23 cm Tube secured with: Tape Dental Injury: Teeth and Oropharynx as per pre-operative assessment

## 2017-08-24 NOTE — Transfer of Care (Signed)
Immediate Anesthesia Transfer of Care Note  Patient: Keith Owen  Procedure(s) Performed: ANTERIOR CERVICAL DECOMPRESSION FUSION CERVICAL FOUR-FIVE,CERVICAL FIVE-SIX,CERVICAL SIX-SEVEN. (N/A Spine Cervical)  Patient Location: PACU  Anesthesia Type:General  Level of Consciousness: awake and patient cooperative  Airway & Oxygen Therapy: Patient Spontanous Breathing and Patient connected to face mask oxygen  Post-op Assessment: Report given to RN and Post -op Vital signs reviewed and stable  Post vital signs: Reviewed and stable  Last Vitals:  Vitals:   08/24/17 0825 08/24/17 1242  BP: 120/71 131/83  Pulse: 66 82  Resp: 18 12  Temp: 36.8 C   SpO2: 96% 93%    Last Pain:  Vitals:   08/24/17 0825  TempSrc: Oral      Patients Stated Pain Goal: 7 (08/24/17 [id number])  Complications: No apparent anesthesia complications

## 2017-08-24 NOTE — Op Note (Signed)
08/24/2017  12:31 PM  PATIENT:  Keith Owen  57 y.o. 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 Decompressive anterior cervical discectomy C4-5 C5-6 C6-7, 2. Anterior cervical arthrodesis C4-5 C5-6 C6-7 utilizing a porous titanium interbody cage packed with locally harvested morcellized autologous bone graft, 3. Anterior cervical plating C4-C7 inclusive utilizing a [employer] Atlantis translational plate  SURGEON:  Marikay Alar, MD  ASSISTANTS: Meyran FNP  ANESTHESIA:   General  EBL: 200 ml  Total I/O In: 1000 [I.V.:1000] Out: 200 [Blood:200]  BLOOD ADMINISTERED: none  DRAINS: none  SPECIMEN:  none  INDICATION FOR PROCEDURE: This patient presented with neck pain with arm pain and numbness and tingling.. Imaging showed spinal stenosis C4-5 C5-6 and C6-7. The patient tried conservative measures without relief. Pain was debilitating. Recommended ACDF with plating. Patient understood the risks, benefits, and alternatives and potential outcomes and wished to proceed.  PROCEDURE DETAILS: Patient was brought to the operating room placed under general endotracheal anesthesia. Patient was placed in the supine position on the operating room table. The neck was prepped with Duraprep and draped in a sterile fashion.   Three cc of local anesthesia was injected and a transverse incision was made on the right side of the neck.  Dissection was carried down thru the subcutaneous tissue and the platysma was  elevated, opened, and undermined with Metzenbaum scissors.  Dissection was then carried out thru an avascular plane leaving the sternocleidomastoid carotid artery and jugular vein laterally and the trachea and esophagus medially. The ventral aspect of the vertebral column was identified and a localizing x-ray was taken. The C4-5 level was identified. The longus colli muscles were then  elevated and the retractor was placed to expose C4-5 C5-6 and C6-7. The annulus was incised and the disc space entered at each level. The exact same decompression was performed at C4-5 C5-6 and C6-7. Discectomy was performed with micro-curettes and pituitary rongeurs. I then used the high-speed drill to drill the endplates down to the level of the posterior longitudinal ligament. The drill shavings were saved in a mucous trap for later arthrodesis. The operating microscope was draped and brought into the field provided additional magnification, illumination and visualization. Discectomy was continued posteriorly thru the disc space. Posterior longitudinal ligament was opened with a nerve hook, and then removed along with disc herniation and osteophytes, decompressing the spinal canal and thecal sac. We then continued to remove osteophytic overgrowth and disc material decompressing the neural foramina and exiting nerve roots bilaterally. The scope was angled up and down to help decompress and undercut the vertebral bodies. Once the decompression was completed we could pass a nerve hook circumferentially to assure adequate decompression in the midline and in the neural foramina. So by both visualization and palpation we felt we had an adequate decompression of the neural elements. We then measured the height of the intravertebral disc space and selected a 8 millimeter PTi interbody cage packed with autograft. It was then gently positioned in the intravertebral disc space(s) and countersunk. I then used a 62 mm Atlantis translational plate and placed fixed angle screws into the vertebral bodies of each level from C4-C7 and locked them into position. The wound was irrigated with bacitracin solution, checked for hemostasis which was established and confirmed. Once meticulous hemostasis was achieved, we then proceeded with closure. The platysma was closed with interrupted 3-0 undyed Vicryl suture, the subcuticular layer  was closed with interrupted 3-0 undyed Vicryl suture. The skin edges were approximated with steristrips. The drapes were removed. A sterile dressing was applied. The patient was then awakened from general anesthesia and transferred to the recovery room in stable condition. At the end of the procedure all sponge, needle and instrument counts were correct.   PLAN OF CARE: Admit for overnight observation  PATIENT DISPOSITION:  PACU - hemodynamically stable.   Delay start of Pharmacological VTE agent (>24hrs) due to surgical blood loss or risk of bleeding:  yes

## 2017-08-25 DIAGNOSIS — M4802 Spinal stenosis, cervical region: Secondary | ICD-10-CM | POA: Diagnosis not present

## 2017-08-25 MED ORDER — OXYCODONE HCL 5 MG PO TABS: 5.0000 mg | ORAL_TABLET | Freq: Four times a day (QID) | ORAL | 0 refills | Status: AC | PRN

## 2017-08-25 MED ORDER — METHOCARBAMOL 500 MG PO TABS: 500.0000 mg | ORAL_TABLET | Freq: Four times a day (QID) | ORAL | 1 refills | Status: AC | PRN

## 2017-08-25 NOTE — Discharge Summary (Signed)
Physician Discharge Summary  Patient ID: Keith Owen MRN: [id number] DOB/AGE: 1960/12/23 57 y.o.  Admit date: 08/24/2017 Discharge date: 08/25/2017  Admission Diagnoses: cervical stenosis    Discharge Diagnoses: same   Discharged Condition: good  Hospital Course: The patient was admitted on 08/24/2017 and taken to the operating room where the patient underwent ACDF . The patient tolerated the procedure well and was taken to the recovery room and then to the floor in stable condition. The hospital course was routine. There were no complications. The wound remained clean dry and intact. Pt had appropriate neck soreness. No complaints of arm pain or new N/T/W. The patient remained afebrile with stable vital signs, and tolerated a regular diet. The patient continued to increase activities, and pain was well controlled with oral pain medications.   Consults: None  Significant Diagnostic Studies:  Results for orders placed or performed during the hospital encounter of 08/17/17  Surgical pcr screen  Result Value Ref Range   MRSA, PCR NEGATIVE NEGATIVE   Staphylococcus aureus NEGATIVE NEGATIVE  Basic metabolic panel  Result Value Ref Range   Sodium 136 135 - 145 mmol/L   Potassium 4.2 3.5 - 5.1 mmol/L   Chloride 104 101 - 111 mmol/L   CO2 22 22 - 32 mmol/L   Glucose, Bld 91 65 - 99 mg/dL   BUN 16 6 - 20 mg/dL   Creatinine, Ser 4.09 (H) 0.61 - 1.24 mg/dL   Calcium 9.3 8.9 - 81.1 mg/dL   GFR calc non Af Amer >60 >60 mL/min   GFR calc Af Amer >60 >60 mL/min   Anion gap 10 5 - 15  CBC WITH DIFFERENTIAL  Result Value Ref Range   WBC 8.2 4.0 - 10.5 K/uL   RBC 4.91 4.22 - 5.81 MIL/uL   Hemoglobin 15.9 13.0 - 17.0 g/dL   HCT 91.4 78.2 - 95.6 %   MCV 92.1 78.0 - 100.0 fL   MCH 32.4 26.0 - 34.0 pg   MCHC 35.2 30.0 - 36.0 g/dL   RDW 21.3 08.6 - 57.8 %   Platelets 184 150 - 400 K/uL   Neutrophils Relative % 70 %   Neutro Abs 5.7 1.7 - 7.7 K/uL   Lymphocytes Relative 21 %   Lymphs  Abs 1.8 0.7 - 4.0 K/uL   Monocytes Relative 8 %   Monocytes Absolute 0.6 0.1 - 1.0 K/uL   Eosinophils Relative 1 %   Eosinophils Absolute 0.1 0.0 - 0.7 K/uL   Basophils Relative 0 %   Basophils Absolute 0.0 0.0 - 0.1 K/uL  Protime-INR  Result Value Ref Range   Prothrombin Time 13.7 11.4 - 15.2 seconds   INR 1.06     Chest 2 View  Result Date: 08/24/2017 CLINICAL DATA:  Preop ACDF EXAM: CHEST - 2 VIEW COMPARISON:  None. FINDINGS: Prior CABG. Heart is normal size. There is diffuse moderate to severe interstitial prominence throughout the lungs. Favor chronic interstitial lung disease. No confluent opacities or effusions. No acute bony abnormality. IMPRESSION: Moderate to severe interstitial prominence, favor chronic interstitial lung disease. Prior CABG. Electronically Signed   By: Charlett Nose M.D.   On: 08/24/2017 09:27   Dg Cervical Spine 2-3 Views  Result Date: 08/24/2017 CLINICAL DATA:  ACDF C4 through C7 EXAM: CERVICAL SPINE - 2-3 VIEW; DG C-ARM 61-120 MIN COMPARISON:  None. FINDINGS: 2 lateral views of the cervical spine were obtained with C-arm Suboptimal  visualization of the lower cervical spine ACDF with anterior plate and interbody  spacer at C4-5 and C5-6 in good position. There appears to be fusion also at C6-7. This portion of the spine is not adequately visualized. IMPRESSION: ACDF C4 through C7.  C6-7 not well visualized Electronically Signed   By: Marlan Palauharles  Clark M.D.   On: 08/24/2017 12:41   Dg C-arm 1-60 Min  Result Date: 08/24/2017 CLINICAL DATA:  ACDF C4 through C7 EXAM: CERVICAL SPINE - 2-3 VIEW; DG C-ARM 61-120 MIN COMPARISON:  None. FINDINGS: 2 lateral views of the cervical spine were obtained with C-arm Suboptimal  visualization of the lower cervical spine ACDF with anterior plate and interbody spacer at C4-5 and C5-6 in good position. There appears to be fusion also at C6-7. This portion of the spine is not adequately visualized. IMPRESSION: ACDF C4 through C7.  C6-7 not  well visualized Electronically Signed   By: Marlan Palauharles  Clark M.D.   On: 08/24/2017 12:41    Antibiotics:  Anti-infectives (From admission, onward)   Start     Dose/Rate Route Frequency Ordered Stop   08/24/17 1430  ceFAZolin (ANCEF) IVPB 2g/100 mL premix     2 g 200 mL/hr over 30 Minutes Intravenous Every 8 hours 08/24/17 1421 08/24/17 2217   08/24/17 1045  bacitracin 50,000 Units in sodium chloride irrigation 0.9 % 500 mL irrigation  Status:  Discontinued       As needed 08/24/17 1045 08/24/17 1236   08/24/17 0823  ceFAZolin (ANCEF) 2-4 GM/100ML-% IVPB    Comments:  Nadelyn Enriques, Tomika   : cabinet override      08/24/17 0823 08/24/17 1018   08/24/17 0817  ceFAZolin (ANCEF) IVPB 2g/100 mL premix     2 g 200 mL/hr over 30 Minutes Intravenous On call to O.R. 08/24/17 0817 08/24/17 1018      Discharge Exam: Blood pressure 122/81, pulse 93, temperature 98.4 F (36.9 C), temperature source Oral, resp. rate 18, height 6' (1.829 m), weight 86 kg (189 lb 9.6 oz), SpO2 98 %. Neurologic: Grossly normal Dressing dry  Discharge Medications:   Allergies as of 08/25/2017   No Known Allergies     Medication List    TAKE these medications   ALPRAZolam 0.5 MG tablet Commonly known as:  XANAX Take 0.5 mg by mouth 2 (two) times daily.   amLODipine 2.5 MG tablet Commonly known as:  NORVASC Take 2.5 mg by mouth daily.   aspirin EC 81 MG tablet Take 81 mg by mouth daily.   citalopram 20 MG tablet Commonly known as:  CELEXA Take 20 mg by mouth daily.   clopidogrel 75 MG tablet Commonly known as:  PLAVIX Take 75 mg by mouth daily.   esomeprazole 20 MG capsule Commonly known as:  NEXIUM Take 20 mg by mouth daily.   ibuprofen 200 MG tablet Commonly known as:  ADVIL,MOTRIN Take 400 mg by mouth every 6 (six) hours as needed for headache or moderate pain.   lisinopril 2.5 MG tablet Commonly known as:  PRINIVIL,ZESTRIL Take 2.5 mg by mouth daily.   methocarbamol 500 MG tablet Commonly known  as:  ROBAXIN Take 1 tablet (500 mg total) by mouth every 6 (six) hours as needed for muscle spasms.   oxyCODONE 5 MG immediate release tablet Commonly known as:  Oxy IR/ROXICODONE Take 1 tablet (5 mg total) by mouth every 6 (six) hours as needed for moderate pain ((score 4 to 6)).   simvastatin 20 MG tablet Commonly known as:  ZOCOR Take 20 mg by mouth daily.   traMADol 50 MG  tablet Commonly known as:  ULTRAM Take 50 mg by mouth 2 (two) times daily as needed for moderate pain.       Disposition: home   Final Dx: ACDF C4-7  Discharge Instructions     Remove dressing in 72 hours   Complete by:  As directed    Call MD for:  difficulty breathing, headache or visual disturbances   Complete by:  As directed    Call MD for:  persistant nausea and vomiting   Complete by:  As directed    Call MD for:  redness, tenderness, or signs of infection (pain, swelling, redness, odor or green/yellow discharge around incision site)   Complete by:  As directed    Call MD for:  severe uncontrolled pain   Complete by:  As directed    Call MD for:  temperature >100.4   Complete by:  As directed    Diet - low sodium heart healthy   Complete by:  As directed    Increase activity slowly   Complete by:  As directed          Signed: Artyom Stencel S 08/25/2017, 7:46 AM

## 2017-08-25 NOTE — Progress Notes (Signed)
Patient alert and oriented, mae's well, voiding adequate amount of urine, swallowing without difficulty, no c/o pain at time of discharge. Patient discharged home with family. Script and discharged instructions given to patient. Patient and family stated understanding of instructions given. Patient has an appointment with Dr. Jones °

## 2017-08-27 ENCOUNTER — Encounter (HOSPITAL_COMMUNITY): Payer: Self-pay | Admitting: Neurological Surgery

## 2017-08-27 MED FILL — Thrombin For Soln 5000 Unit: CUTANEOUS | Qty: 5000 | Status: AC

## 2017-08-27 MED FILL — Gelatin Absorbable MT Powder: OROMUCOSAL | Qty: 1 | Status: AC

## 2019-10-08 IMAGING — CR DG CHEST 2V
2 series · 2 of 2 positions shown · non-contrast
Comparison: None.

CLINICAL DATA: Preop ACDF

EXAM:
CHEST - 2 VIEW

[w chest pa]
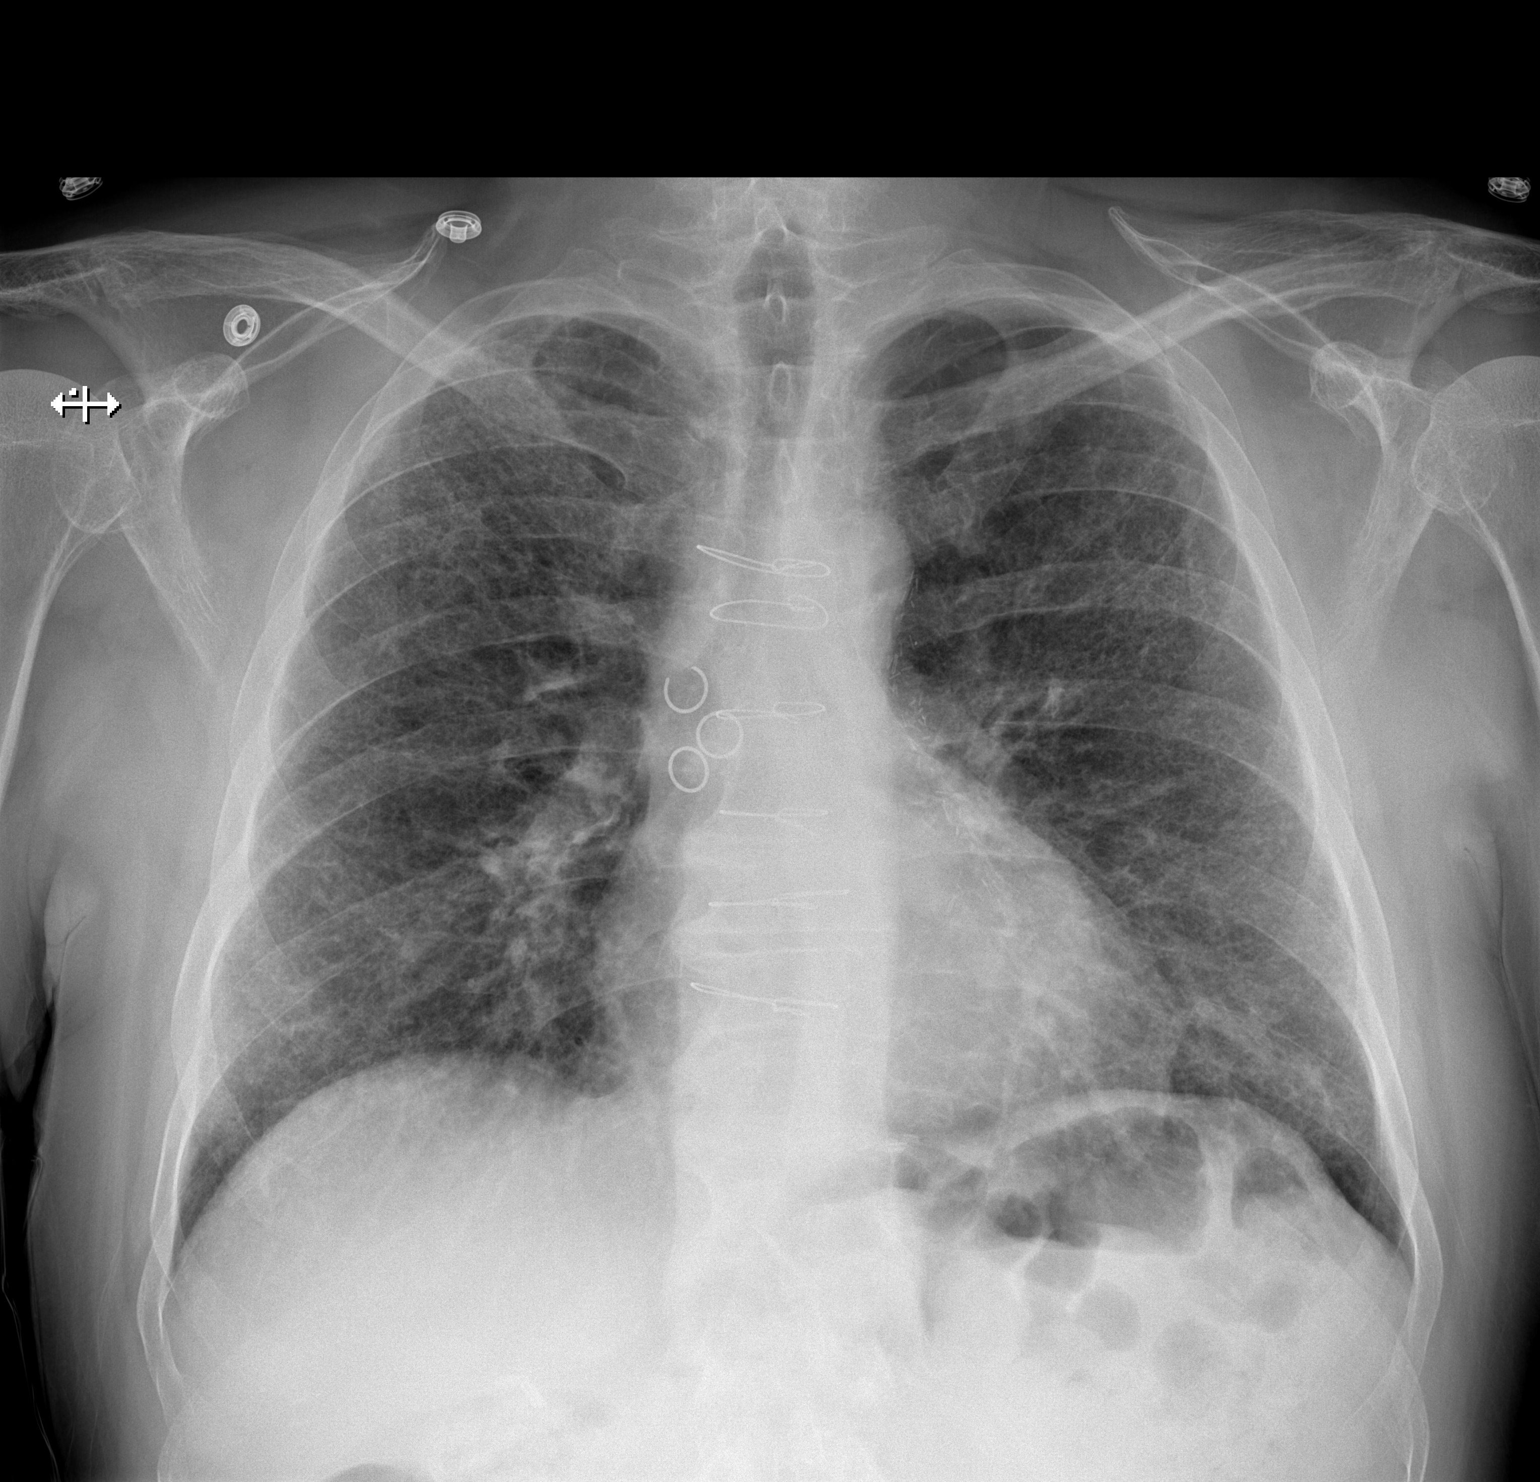

[w chest lat]
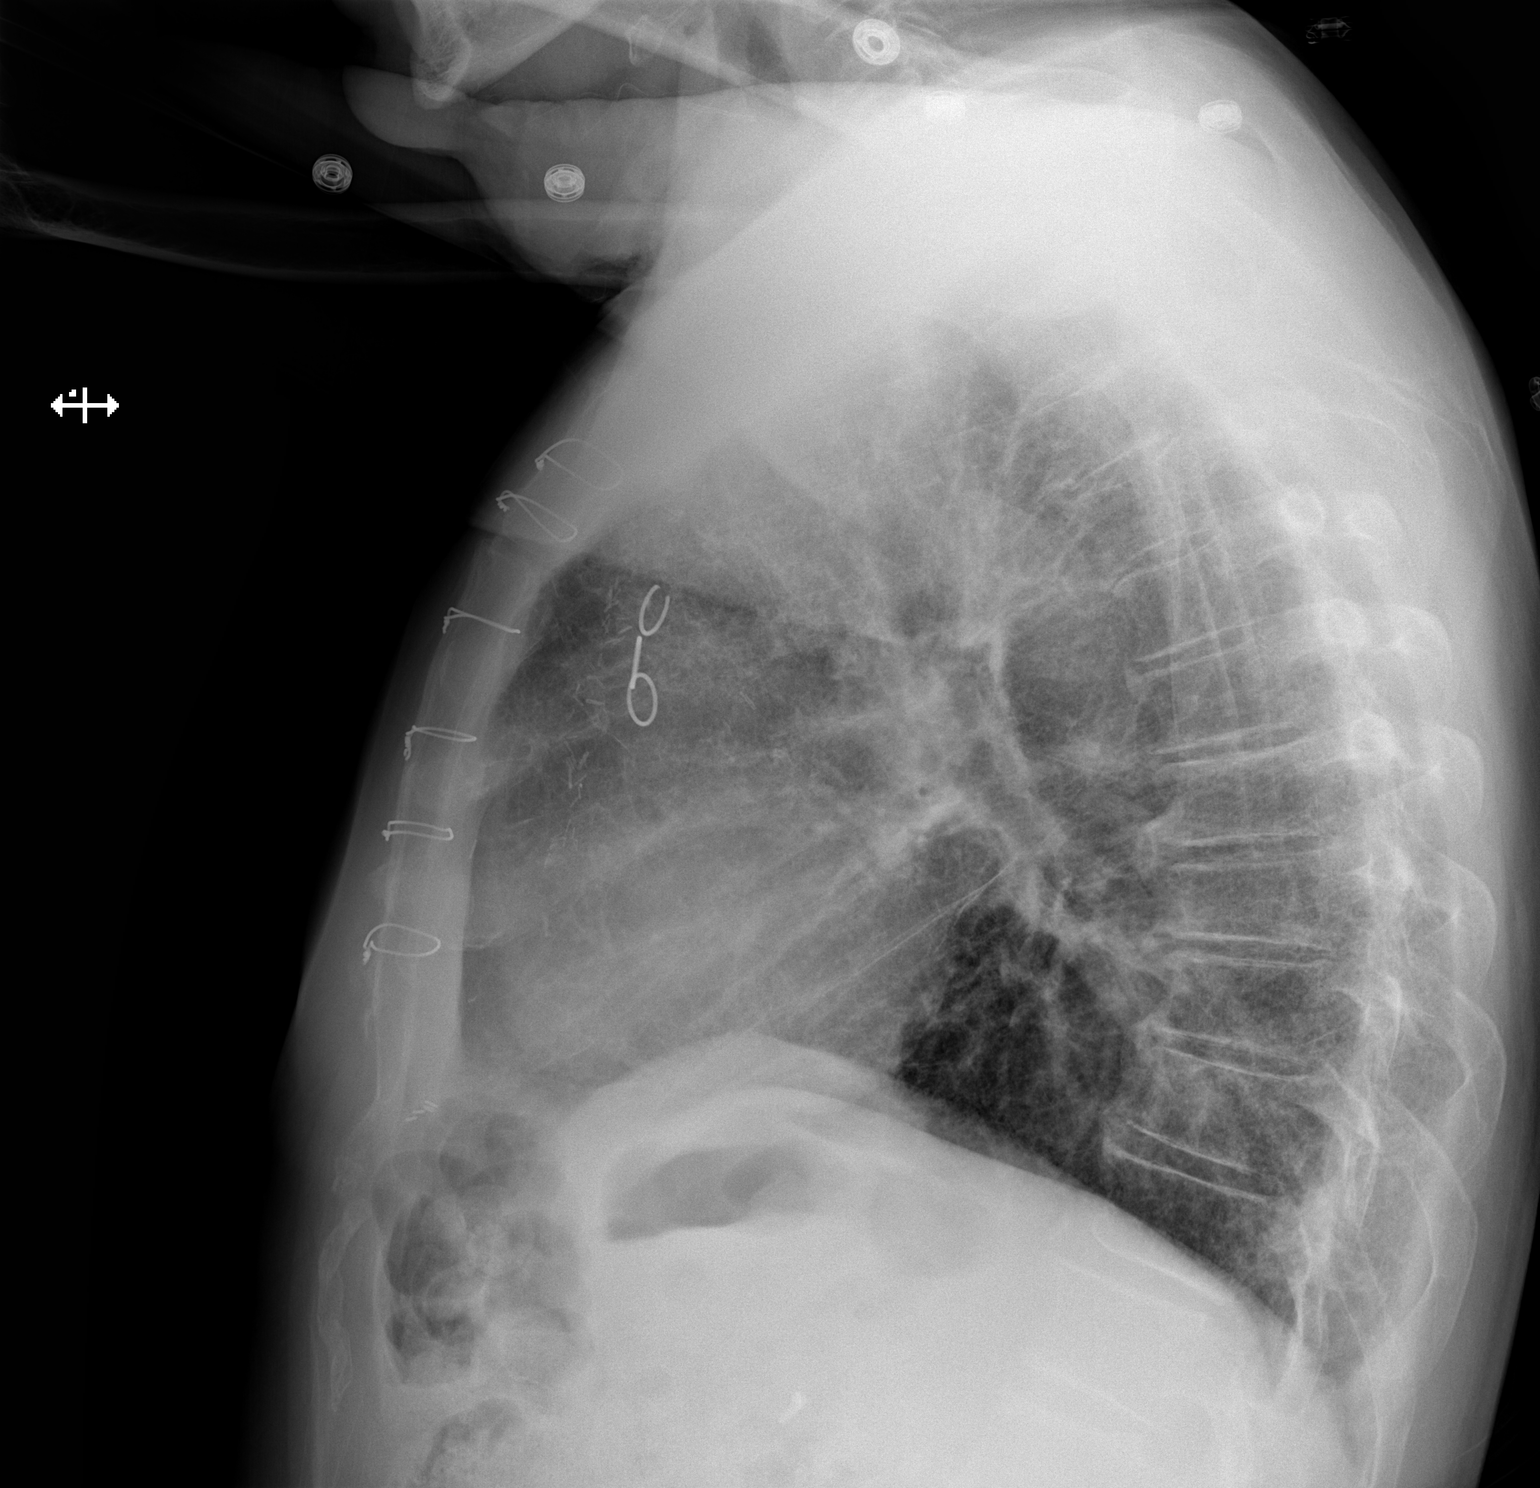

[2 of 2 positions shown; findings below may reference images not displayed]

FINDINGS: Prior CABG. Heart is normal size. There is diffuse moderate to
severe interstitial prominence throughout the lungs. Favor chronic
interstitial lung disease. No confluent opacities or effusions. No
acute bony abnormality.
IMPRESSION: Moderate to severe interstitial prominence, favor chronic
interstitial lung disease.

Prior CABG.

## 2020-06-19 DIAGNOSIS — Z860100 Personal history of colon polyps, unspecified: Secondary | ICD-10-CM | POA: Insufficient documentation

## 2021-08-12 ENCOUNTER — Other Ambulatory Visit (INDEPENDENT_AMBULATORY_CARE_PROVIDER_SITE_OTHER): Payer: Self-pay | Admitting: INTERVENTIONAL CARDIOLOGY

## 2021-08-12 DIAGNOSIS — I2510 Atherosclerotic heart disease of native coronary artery without angina pectoris: Secondary | ICD-10-CM

## 2021-08-17 ENCOUNTER — Inpatient Hospital Stay
Admission: RE | Admit: 2021-08-17 | Discharge: 2021-08-17 | Disposition: A | Discharge status: Home / Self Care | Payer: BC Managed Care – PPO | Source: Ambulatory Visit | Attending: INTERVENTIONAL CARDIOLOGY | Admitting: INTERVENTIONAL CARDIOLOGY

## 2021-08-17 ENCOUNTER — Other Ambulatory Visit: Payer: Self-pay

## 2021-08-17 DIAGNOSIS — I2510 Atherosclerotic heart disease of native coronary artery without angina pectoris: Secondary | ICD-10-CM | POA: Insufficient documentation

## 2021-08-17 MED ORDER — SODIUM CHLORIDE 0.9 % INJECTION SOLUTION
2.0000 mL | Freq: Once | INTRAVENOUS | Status: DC | PRN
Start: 2021-08-17 — End: 2021-08-18
  Administered 2021-08-17: 5 mL via INTRAVENOUS
  Filled 2021-08-17: qty 2

## 2021-08-19 ENCOUNTER — Encounter (INDEPENDENT_AMBULATORY_CARE_PROVIDER_SITE_OTHER): Payer: Self-pay | Admitting: INTERVENTIONAL CARDIOLOGY

## 2021-08-19 DIAGNOSIS — I2510 Atherosclerotic heart disease of native coronary artery without angina pectoris: Secondary | ICD-10-CM

## 2021-08-23 ENCOUNTER — Encounter (INDEPENDENT_AMBULATORY_CARE_PROVIDER_SITE_OTHER): Payer: Self-pay | Admitting: INTERVENTIONAL CARDIOLOGY

## 2021-08-23 ENCOUNTER — Other Ambulatory Visit: Payer: Self-pay

## 2021-08-23 ENCOUNTER — Ambulatory Visit (INDEPENDENT_AMBULATORY_CARE_PROVIDER_SITE_OTHER): Payer: BC Managed Care – PPO | Admitting: INTERVENTIONAL CARDIOLOGY

## 2021-08-23 VITALS — BP 124/68 | HR 97 | Ht 73.0 in | Wt 204.5 lb

## 2021-08-23 DIAGNOSIS — I255 Ischemic cardiomyopathy: Secondary | ICD-10-CM

## 2021-08-23 DIAGNOSIS — R079 Chest pain, unspecified: Secondary | ICD-10-CM

## 2021-08-23 DIAGNOSIS — I351 Nonrheumatic aortic (valve) insufficiency: Secondary | ICD-10-CM

## 2021-08-23 DIAGNOSIS — Z720 Tobacco use: Secondary | ICD-10-CM

## 2021-08-23 DIAGNOSIS — I2510 Atherosclerotic heart disease of native coronary artery without angina pectoris: Secondary | ICD-10-CM

## 2021-08-23 NOTE — Progress Notes (Signed)
Cardiology Clinic [facility] Cardiology    Name: Gabriel Best.  Age: 61 y.o.  Date of Service: 08/23/2021    Primary Care Provider: Ronalee Red, MD  Chief Complaint:   Chief Complaint   Patient presents with   . Hospital Follow Up   . Heart Disease       Subjective:  The patient has a history of coronary artery disease.  He had four-vessel bypass at age 65 after MI performed at [facility].  LV function was preserved.  In 2013, the patient had a non-ST elevation MI and underwent cardiac catheterization by Dr. Bari Edward at [facility] in [place].  He was found to have an occlusion in the vein graft to the left circumflex, which was borderline stenosis 60% to 70%.  There was 75% stenosis in the mid RCA that had a patent vein graft and there was also patent graft to the diagonal.  Stress test in 2018 for preoperative assessment was low risk study.  EF was 64% on resting imaging.  There was no TID.  There was a moderate size apical perfusion defect that was largely fixed consistent with previous defect just slightly extended.  Repeat stress test in May 2022 was a normal, low risk study, normal myocardial perfusion imaging.  EF was slightly reduced to 46%. Echocardiogram in March 2022 showed EF 45% with moderate aortic insufficiency.    08/23/21 The patient presents for evaluation after recent ER visit.  The patient had left-sided chest pain.  This is since resolved.  He thinks he may be strained a muscle.  He was helping his daughter with the cabinets in her kitchen.  He has no symptoms over the last week.  Echocardiogram done less than a week ago appears to be fairly stable although there is moderate aortic insufficiency.    Past Medical History:  Past Medical History:   Diagnosis Date   . Atherosclerotic heart disease of native coronary artery without angina pectoris    . Essential hypertension    . Hyperlipidemia    . Tobacco abuse          Social History:  Social History     Tobacco Use    Smoking Status Former   . Packs/day: 1.00   . Types: Cigarettes   . Quit date: 07/2021   . Years since quitting: 0.0   Smokeless Tobacco Never      Social History     Substance and Sexual Activity   Alcohol Use Never      Social History     Substance and Sexual Activity   Drug Use Never      Current Medications:  Current Outpatient Medications   Medication Sig   . ALPRAZolam (XANAX) 1 mg Oral Tablet Take 1 Tablet (1 mg total) by mouth Twice per day as needed   . aspirin (ECOTRIN) 81 mg Oral Tablet, Delayed Release (E.C.) Take 1 Tablet (81 mg total) by mouth Once a day   . atorvastatin (LIPITOR) 40 mg Oral Tablet Take 1 Tablet (40 mg total) by mouth Once a day for cholesterol   . citalopram (CELEXA) 20 mg Oral Tablet Take 1 Tablet (20 mg total) by mouth Once a day   . clopidogreL (PLAVIX) 75 mg Oral Tablet Take 1 Tablet (75 mg total) by mouth Once a day   . cyanocobalamin (VITAMIN B12) 1,000 mcg/mL Injection Solution Inject 1 mL (1,000 mcg total) into the muscle Every 30 days   . docusate  sodium (COLACE) 100 mg Oral Capsule Take 1 Capsule (100 mg total) by mouth Twice daily   . folic acid 0.8 mg Oral Capsule Take 1 Capsule (0.8 mg total) by mouth Once a day   . lisinopriL (PRINIVIL) 5 mg Oral Tablet Take 0.5 Tablets (2.5 mg total) by mouth Once a day   . nitroGLYCERIN (NITROSTAT) 0.4 mg Sublingual Tablet, Sublingual Place 1 Tablet (0.4 mg total) under the tongue Every 5 minutes as needed for Chest pain for 3 doses over 15 minutes   . traMADoL (ULTRAM) 50 mg Oral Tablet Take 1 Tablet (50 mg total) by mouth Twice per day as needed     Allergies:  No Known Allergies   Review of Systems:  Complete ROS was performed and otherwise negative unless noted in HPI.      Vital Signs:  Vitals:    08/23/21 1325   BP: 124/68   Pulse: 97   SpO2: 95%   Weight: 92.8 kg (204 lb 8 oz)   Height: 1.854 m (6\' 1" )   BMI: 27.04      Physical Exam:  General: Pt resting comfortably in no acute distress and appears stated age.    Neck: No  JVD, no carotid bruit. Neck supple, symmetrical, trachea midline.   Lungs:  Normal respiratory effort, lungs clear to auscultation bilaterally.    Cardiovascular: Regular rate and rhythm without murmurs rubs or gallops and vascular pulses are 2+ throughout.  Abdomen: Soft, non-tender and bowel sounds normal.    Extremities: Extremities normal, atraumatic, no cyanosis or edema.    Neurologic: Alert and oriented x3.     Previous Studies     Last Echocardiogram 08/2021    Last Cardiac Catheterization  2013    Last Myocardial Perfusion Scan 10/2020    Assessment:  Coronary artery disease, unspecified vessel or lesion type, unspecified whether angina present, unspecified whether native or transplanted heart    Chest pain, unspecified type    Ischemic cardiomyopathy    Nonrheumatic aortic valve insufficiency    Tobacco use    Plan:    The patient's chest pain appears to be noncardiac so will just monitor for now and defer any further testing.  We will repeat an echocardiogram prior to his next visit to evaluate aortic insufficiency and cardiomyopathy.    Orders placed this visit:  Orders Placed This Encounter   . EKG (In-Clinic Today)     11/2020, MD  Interventional Cardiology    A portion of this documentation may have been generated using [facility] voice recognition software and may contain syntax/voice recognition errors.

## 2021-08-23 NOTE — Nursing Note (Signed)
Patient here for follow up ER visit in Feb. 2023 for chest pain

## 2021-08-31 LAB — ECG W INTERP (AMB USE ONLY)(MUSE,IN CLINIC)
Atrial Rate: 94 {beats}/min
Calculated P Axis: 92 degrees
Calculated R Axis: 61 degrees
Calculated T Axis: 88 degrees
PR Interval: 142 ms
QRS Duration: 90 ms
QT Interval: 378 ms
QTC Calculation: 472 ms
Ventricular rate: 94 {beats}/min

## 2021-09-15 DIAGNOSIS — R931 Abnormal findings on diagnostic imaging of heart and coronary circulation: Secondary | ICD-10-CM

## 2021-09-29 ENCOUNTER — Encounter (INDEPENDENT_AMBULATORY_CARE_PROVIDER_SITE_OTHER): Payer: Self-pay | Admitting: INTERVENTIONAL CARDIOLOGY

## 2021-10-30 ENCOUNTER — Other Ambulatory Visit: Payer: Self-pay

## 2021-10-30 ENCOUNTER — Emergency Department (HOSPITAL_COMMUNITY): Payer: BC Managed Care – PPO

## 2021-10-30 ENCOUNTER — Emergency Department
Admission: EM | Admit: 2021-10-30 | Discharge: 2021-10-30 | Disposition: A | Discharge status: Home / Self Care | Payer: BC Managed Care – PPO | Attending: Physician Assistant | Admitting: Physician Assistant

## 2021-10-30 DIAGNOSIS — I2510 Atherosclerotic heart disease of native coronary artery without angina pectoris: Secondary | ICD-10-CM | POA: Insufficient documentation

## 2021-10-30 DIAGNOSIS — F17210 Nicotine dependence, cigarettes, uncomplicated: Secondary | ICD-10-CM | POA: Insufficient documentation

## 2021-10-30 DIAGNOSIS — J189 Pneumonia, unspecified organism: Secondary | ICD-10-CM | POA: Insufficient documentation

## 2021-10-30 DIAGNOSIS — K219 Gastro-esophageal reflux disease without esophagitis: Secondary | ICD-10-CM | POA: Insufficient documentation

## 2021-10-30 DIAGNOSIS — R079 Chest pain, unspecified: Secondary | ICD-10-CM

## 2021-10-30 DIAGNOSIS — Z951 Presence of aortocoronary bypass graft: Secondary | ICD-10-CM | POA: Insufficient documentation

## 2021-10-30 LAB — COMPREHENSIVE METABOLIC PANEL, NON-FASTING
ALBUMIN/GLOBULIN RATIO: 1.4 (ref 0.8–1.4)
ALBUMIN: 4.1 g/dL (ref 3.5–5.7)
ALKALINE PHOSPHATASE: 82 U/L (ref 34–104)
ALT (SGPT): 7 U/L (ref 7–52)
ANION GAP: 7 mmol/L — ABNORMAL LOW (ref 10–20)
AST (SGOT): 13 U/L (ref 13–39)
BILIRUBIN TOTAL: 0.6 mg/dL (ref 0.3–1.2)
BUN/CREA RATIO: 15 (ref 6–22)
BUN: 23 mg/dL (ref 7–25)
CALCIUM, CORRECTED: 8.8 mg/dL — ABNORMAL LOW (ref 8.9–10.8)
CALCIUM: 8.9 mg/dL (ref 8.6–10.3)
CHLORIDE: 105 mmol/L (ref 98–107)
CO2 TOTAL: 24 mmol/L (ref 21–31)
CREATININE: 1.58 mg/dL — ABNORMAL HIGH (ref 0.60–1.30)
ESTIMATED GFR: 50 mL/min/{1.73_m2} — ABNORMAL LOW (ref >59)
GLOBULIN: 2.9 (ref 2.9–5.4)
GLUCOSE: 109 mg/dL (ref 74–109)
OSMOLALITY, CALCULATED: 276 mOsm/kg (ref 270–290)
POTASSIUM: 3.8 mmol/L (ref 3.5–5.1)
PROTEIN TOTAL: 7 g/dL (ref 6.4–8.9)
SODIUM: 136 mmol/L (ref 136–145)

## 2021-10-30 LAB — CBC WITH DIFF
BASOPHIL #: 0.1 10*3/uL (ref 0.00–0.30)
BASOPHIL %: 1 % (ref 0–3)
EOSINOPHIL #: 0.2 10*3/uL (ref 0.00–0.80)
EOSINOPHIL %: 2 % (ref 0–7)
HCT: 45 % (ref 42.0–51.0)
HGB: 15.5 g/dL (ref 13.5–18.0)
LYMPHOCYTE #: 2.8 10*3/uL (ref 1.10–5.00)
LYMPHOCYTE %: 31 % (ref 25–45)
MCH: 31.1 pg (ref 27.0–32.0)
MCHC: 34.5 g/dL (ref 32.0–36.0)
MCV: 90.3 fL (ref 78.0–99.0)
MONOCYTE #: 0.8 10*3/uL (ref 0.00–1.30)
MONOCYTE %: 9 % (ref 0–12)
MPV: 8.9 fL (ref 7.4–10.4)
NEUTROPHIL #: 5 10*3/uL (ref 1.80–8.40)
NEUTROPHIL %: 57 % (ref 40–76)
PLATELETS: 151 10*3/uL (ref 140–440)
RBC: 4.98 10*6/uL (ref 4.20–6.00)
RDW: 13.5 % (ref 11.6–14.8)
WBC: 8.9 10*3/uL (ref 4.0–10.5)
WBCS UNCORRECTED: 8.9 10*3/uL

## 2021-10-30 LAB — TROPONIN-I
TROPONIN I: 10 ng/L (ref <20)
TROPONIN I: 11 ng/L (ref <20)

## 2021-10-30 MED ORDER — ASPIRIN 81 MG CHEWABLE TABLET
CHEWABLE_TABLET | ORAL | Status: AC
Start: 2021-10-30 — End: 2021-10-30
  Filled 2021-10-30: qty 4

## 2021-10-30 MED ORDER — LIDOCAINE HCL 2 % MUCOSAL SOLUTION
Status: AC
Start: 2021-10-30 — End: 2021-10-30
  Filled 2021-10-30: qty 15

## 2021-10-30 MED ORDER — HYOSCYAMINE 0.125 MG/5 ML ORAL ELIXIR
10.0000 mL | ORAL_SOLUTION | Freq: Once | ORAL | Status: AC
Start: 2021-10-30 — End: 2021-10-30
  Administered 2021-10-30: 10 mL via ORAL

## 2021-10-30 MED ORDER — HYOSCYAMINE 0.125 MG/5 ML ORAL ELIXIR
ORAL_SOLUTION | ORAL | Status: AC
Start: 2021-10-30 — End: 2021-10-30
  Filled 2021-10-30: qty 10

## 2021-10-30 MED ORDER — ALUMINUM-MAG HYDROXIDE-SIMETHICONE 200 MG-200 MG-20 MG/5 ML ORAL SUSP
ORAL | Status: AC
Start: 2021-10-30 — End: 2021-10-30
  Filled 2021-10-30: qty 30

## 2021-10-30 MED ORDER — ALUMINUM-MAG HYDROXIDE-SIMETHICONE 200 MG-200 MG-20 MG/5 ML ORAL SUSP
30.0000 mL | Freq: Once | ORAL | Status: AC
Start: 2021-10-30 — End: 2021-10-30
  Administered 2021-10-30: 30 mL via ORAL

## 2021-10-30 MED ORDER — DOXYCYCLINE HYCLATE 100 MG CAPSULE
100.0000 mg | ORAL_CAPSULE | Freq: Two times a day (BID) | ORAL | 0 refills | Status: DC
Start: 2021-10-30 — End: 2021-11-23

## 2021-10-30 MED ORDER — ASPIRIN 81 MG CHEWABLE TABLET
324.0000 mg | CHEWABLE_TABLET | ORAL | Status: AC
Start: 2021-10-30 — End: 2021-10-30
  Administered 2021-10-30: 324 mg via ORAL

## 2021-10-30 MED ORDER — LIDOCAINE HCL 2 % MUCOSAL SOLUTION
15.0000 mL | Freq: Once | Status: AC
Start: 2021-10-30 — End: 2021-10-30
  Administered 2021-10-30: 15 mL via ORAL

## 2021-10-30 NOTE — Discharge Instructions (Signed)
Drink plenty of fluids. Continue any at home medications as previously prescribed. Take medications that are prescribed from today's visit as prescribed. Discuss any questions you may have concerning your medications with your pharmacist. Follow up with your regular PCP in the next 2-3 days. Return to the ED if symptoms worsen, change, or do not improve.

## 2021-10-30 NOTE — ED Provider Notes (Signed)
[facility]  ED Primary Provider Note  History of Present Illness   Chief Complaint   Patient presents with   . Chest Pain      Gabriel Best Gabriel Best. is a 61 y.o. male who had concerns including Chest Pain .  Arrival: The patient arrived by Car    Patient is a 60 year old male past medical history of coronary artery disease status post CABG many years ago tobacco use currently smokes half pack per day presents emergency department with complaints of chest pain.  He states it started this evening.  He states he has been having acid reflux for the past 2-3 days.  He states started around 1:00 a.m. this morning.  States it is worse when he lies down gets better when he sits up.  Describes it as midsternal pressure.  Denies radiation nausea shortness of breath lower extremity swelling or pain.  Follows with Dr. Leonides Schanz.  He is not taking any medications this evening for heartburn or chest pain        Review of Systems   Pertinent positive and negative ROS as per HPI.  Historical Data   History Reviewed This Encounter: Medical History  Surgical History  Family History  Social History      Physical Exam   ED Triage Vitals [10/30/21 0508]   BP (Non-Invasive) (!) 141/78   Heart Rate 60   Respiratory Rate 18   Temperature 36.6 C (97.8 F)   SpO2 97 %   Weight 90.7 kg (200 lb)   Height 1.829 m (6')     Physical Exam  Constitutional:       Appearance: Normal appearance.   HENT:      Nose: Nose normal.   Eyes:      Extraocular Movements: Extraocular movements intact.      Pupils: Pupils are equal, round, and reactive to light.   Cardiovascular:      Rate and Rhythm: Normal rate and regular rhythm.      Pulses: Normal pulses.      Heart sounds: Normal heart sounds.   Pulmonary:      Effort: Pulmonary effort is normal.      Breath sounds: Wheezing present.   Neurological:      Mental Status: He is alert.       Patient Data     Labs Ordered/Reviewed   COMPREHENSIVE METABOLIC PANEL, NON-FASTING -  Abnormal; Notable for the following components:       Result Value    ANION GAP 7 (*)     CREATININE 1.58 (*)     ESTIMATED GFR 50 (*)     CALCIUM, CORRECTED 8.8 (*)     All other components within normal limits    Narrative:     Estimated Glomerular Filtration Rate (eGFR) is calculated using the CKD-EPI (2021) equation, intended for patients 79 years of age and older. If gender is not documented or "unknown", there will be no eGFR calculation.   TROPONIN-I - Normal   TROPONIN-I - Normal   CBC/DIFF    Narrative:     The following orders were created for panel order CBC/DIFF.  Procedure                               Abnormality         Status                     ---------                               -----------         ------  CBC WITH FMBB[[id number]]                                    Final result                 Please view results for these tests on the individual orders.   CBC WITH DIFF     XR AP MOBILE CHEST   Final Result by Edi, Radresults In (05/14 8381)   As above.         Radiologist location ID: [facility] Decision Making        Medical Decision Making  Patient was given aspirin and GI cocktail upon arrival.  Initial EKGs did not demonstrate any acute ST changes.  Patient signed out pending repeat troponins    Amount and/or Complexity of Data Reviewed  Radiology: ordered.      Risk  OTC drugs.  Prescription drug management.               Medications Administered in the ED   aspirin chewable tablet 324 mg (324 mg Oral Given 10/30/21 0525)   aluminum-magnesium hydroxide-simethicone (MAG-AL PLUS) 200-200-20 mg per 5 mL oral liquid (30 mL Oral Given 10/30/21 0537)     And   hyoscyamine (HYOSYNE) 0.125 mg per 5 mL oral liquid (10 mL Oral Given 10/30/21 0537)     And   lidocaine (XYLOCAINE) 2% oral topical viscous solution (15 mL Oral Given 10/30/21 0537)     Clinical Impression   Pneumonia (Primary)   Chest pain, unspecified type   Gastroesophageal reflux disease,  unspecified whether esophagitis present       Disposition: Discharged

## 2021-10-30 NOTE — ED APP Handoff Note (Signed)
After transferred care to me patient was awaiting remainder of lab work.  His repeat troponin is also negative.  Chest x-ray does show patchy infiltrates.  On re-evaluation patient was seen be sitting upright in bed in no acute distress.  He states he is feeling much better at this time.  Patient admits that he has been having acid reflux.  He states that he had previously been on Prilosec but has stopped taking this as his acid reflux was better.  He does have established cardiac care with Dr. Elesa Massed.  I did discuss various treatment options with the patient.  He feels comfortable going home at this time.  He states he will call Dr. Jolyn Nap office tomorrow to arrange follow-up.  I will go ahead and place him on doxycycline for treatment the pneumonia.

## 2021-10-30 NOTE — ED Nurses Note (Signed)
PT DCD HOME, DC INSTRUCTIONS REVIEWED, AND PT STATES VERBAL UNDERSTANDING. PT INSTRUCTED TO FOLLOW UP W/PCP IN 2-3 DAYS, AND KEEP APPOINTMENT W/DR WARD IN [place]

## 2021-10-30 NOTE — ED Triage Notes (Addendum)
C/o substernal chest pain. Also pt states, "I've had acid reflux the last couple of nights as well." Hx of MI, HTN. Currently does not have chest pain. Pt states, "as long as I'm sitting up, the pain is gone."

## 2021-10-31 LAB — ECG 12 LEAD
Atrial Rate: 62 {beats}/min
Calculated P Axis: 55 degrees
Calculated R Axis: 35 degrees
Calculated T Axis: 28 degrees
PR Interval: 160 ms
QRS Duration: 96 ms
QT Interval: 430 ms
QTC Calculation: 436 ms
Ventricular rate: 62 {beats}/min

## 2021-11-03 ENCOUNTER — Encounter (INDEPENDENT_AMBULATORY_CARE_PROVIDER_SITE_OTHER): Payer: Self-pay | Admitting: INTERVENTIONAL CARDIOLOGY

## 2021-11-03 ENCOUNTER — Ambulatory Visit (INDEPENDENT_AMBULATORY_CARE_PROVIDER_SITE_OTHER): Payer: Self-pay | Admitting: INTERVENTIONAL CARDIOLOGY

## 2021-11-03 ENCOUNTER — Other Ambulatory Visit: Payer: Self-pay

## 2021-11-03 VITALS — BP 119/70 | HR 80 | Ht 72.0 in | Wt 199.5 lb

## 2021-11-03 DIAGNOSIS — I351 Nonrheumatic aortic (valve) insufficiency: Secondary | ICD-10-CM

## 2021-11-03 DIAGNOSIS — I10 Essential (primary) hypertension: Secondary | ICD-10-CM

## 2021-11-03 DIAGNOSIS — R9431 Abnormal electrocardiogram [ECG] [EKG]: Secondary | ICD-10-CM

## 2021-11-03 DIAGNOSIS — I208 Other forms of angina pectoris: Secondary | ICD-10-CM

## 2021-11-03 DIAGNOSIS — E785 Hyperlipidemia, unspecified: Secondary | ICD-10-CM

## 2021-11-03 DIAGNOSIS — I2510 Atherosclerotic heart disease of native coronary artery without angina pectoris: Secondary | ICD-10-CM

## 2021-11-03 MED ORDER — AMLODIPINE 2.5 MG TABLET
2.5000 mg | ORAL_TABLET | Freq: Every day | ORAL | 4 refills | Status: DC
Start: 2021-11-03 — End: 2022-01-10

## 2021-11-03 NOTE — Progress Notes (Signed)
Cardiology Clinic [facility] Cardiology    Name: Gabriel Best.  Age: 61 y.o.  Date of Service: 11/03/2021    Primary Care Provider: Ronalee Red, MD  Chief Complaint:   Chief Complaint   Patient presents with    Hospital Follow Up    Chest Pain       Subjective:  Gabriel Dodge Grealish Montez Hageman. is a very pleasant 61 y.o. male with a past medical history significant for coronary artery disease.  He had four-vessel bypass at age 55 after MI performed at [facility].  LV function was preserved.  In 2013, the patient had a non-ST elevation MI and underwent cardiac catheterization by Dr. Bari Edward at [facility] in [place].  He was found to have an occlusion in the vein graft to the left circumflex, which was borderline stenosis 60% to 70%.  There was 75% stenosis in the mid RCA that had a patent vein graft and there was also patent graft to the diagonal.  Stress test in 2018 for preoperative assessment was low risk study.  EF was 64% on resting imaging.  There was no TID.  There was a moderate size apical perfusion defect that was largely fixed consistent with previous defect just slightly extended.  Repeat stress test in May 2022 was a normal, low risk study, normal myocardial perfusion imaging.  EF was slightly reduced to 46%. Echocardiogram in March 2022 showed EF 45% with moderate aortic insufficiency.    08/23/21 The patient presents for evaluation after recent ER visit.  The patient had left-sided chest pain.  This is since resolved.  He thinks he maybe strained a muscle.  He was helping his daughter with the cabinets in her kitchen.  He has no symptoms over the last week.  Echocardiogram done less than a week ago appears to be fairly stable although there is moderate aortic insufficiency.    11/03/21 The patient is here due to chest pain.  He states he developed chest pain that woke him up Sunday night.  It started off on the left side but then radiated to the right side and then midsternal.  Symptoms  within radiate up through withdrawal similar to reflux.  He used to take Prilosec for reflux but stopped this several years ago.  Sometimes position change will help with symptoms and he was able to get back to sleep, but then symptoms persisted throughout the day the next day.  He ended up going to the emergency room for evaluation.  His cardiac workup was negative.  His symptoms did improve with a GI cocktail.  He was diagnosed with pneumonia, but chest x-ray showed chronic changes and he had no symptoms related to pneumonia.  He is now on Pepcid and Prilosec, but he continues to have symptoms.    Past Medical History:  Past Medical History:   Diagnosis Date    Atherosclerotic heart disease of native coronary artery without angina pectoris     Essential hypertension     Hyperlipidemia     Tobacco abuse          Social History:  Social History     Tobacco Use   Smoking Status Former    Packs/day: 1.00    Types: Cigarettes    Quit date: 07/2021    Years since quitting: 0.2   Smokeless Tobacco Never      Social History     Substance and Sexual Activity   Alcohol Use Never  Social History     Substance and Sexual Activity   Drug Use Never      Current Medications:  Current Outpatient Medications   Medication Sig    ALPRAZolam (XANAX) 1 mg Oral Tablet Take 1 Tablet (1 mg total) by mouth Twice per day as needed    amLODIPine (NORVASC) 2.5 mg Oral Tablet Take 1 Tablet (2.5 mg total) by mouth Once a day    aspirin (ECOTRIN) 81 mg Oral Tablet, Delayed Release (E.C.) Take 1 Tablet (81 mg total) by mouth Once a day    atorvastatin (LIPITOR) 40 mg Oral Tablet Take 1 Tablet (40 mg total) by mouth Once a day for cholesterol    citalopram (CELEXA) 20 mg Oral Tablet Take 1 Tablet (20 mg total) by mouth Once a day    clopidogreL (PLAVIX) 75 mg Oral Tablet Take 1 Tablet (75 mg total) by mouth Once a day    cyanocobalamin (VITAMIN B12) 1,000 mcg/mL Injection Solution Inject 1 mL (1,000 mcg total) into the muscle Every 30 days     docusate sodium (COLACE) 100 mg Oral Capsule Take 1 Capsule (100 mg total) by mouth Twice daily    doxycycline hyclate (VIBRAMYCIN) 100 mg Oral Capsule Take 1 Capsule (100 mg total) by mouth Twice daily for 10 days    folic acid 0.8 mg Oral Capsule Take 1 Capsule (0.8 mg total) by mouth Once a day    nitroGLYCERIN (NITROSTAT) 0.4 mg Sublingual Tablet, Sublingual Place 1 Tablet (0.4 mg total) under the tongue Every 5 minutes as needed for Chest pain for 3 doses over 15 minutes    traMADoL (ULTRAM) 50 mg Oral Tablet Take 1 Tablet (50 mg total) by mouth Twice per day as needed     Allergies:  No Known Allergies   Review of Systems:  Complete ROS was performed and otherwise negative unless noted in HPI.      Vital Signs:  Vitals:    11/03/21 1350   BP: 119/70   Pulse: 80   SpO2: 95%   Weight: 90.5 kg (199 lb 8 oz)   Height: 1.829 m (6')   BMI: 27.11      Physical Exam:  General: Pt resting comfortably in no acute distress and appears stated age.    Neck: No JVD, no carotid bruit. Neck supple, symmetrical, trachea midline.   Lungs:  Normal respiratory effort, lungs clear to auscultation bilaterally.    Cardiovascular:  Regular rate and rhythm.  Normal S1 and S2 without murmur, gallop, or rub.  Abdomen: Soft, non-tender and bowel sounds normal.    Extremities: Extremities normal, atraumatic, no cyanosis or edema.    Neurologic: Alert and oriented x3.       Assessment:    Atypical angina (CMS HCC)    CAD in native artery    Aortic insufficiency    Essential hypertension    Hyperlipidemia, unspecified hyperlipidemia type      Plan:   Patient's symptoms are concerning for angina.  He has not had a heart catheterization in over 10 years.  Patient's symptoms could be multifactorial, will change his lisinopril to amlodipine.  Continue Pepcid and Prilosec. Recommend left heart catheterization for further evaluation.  Risks and benefits discussed with patient he is agreeable.  Return to office after procedure.  Patient seen and  examined by myself and Dr. Elesa Massed.  Assessment and plan discussed and agreed upon as documented.  See Dr. Jolyn Nap plan for any additional details.     Orders  placed this visit:  Orders Placed This Encounter    EKG (In-Clinic Today)    amLODIPine (NORVASC) 2.5 mg Oral Tablet       Gabriel D Kilduff Montez Hageman. is to return to clinic for follow up with the understanding that should symptoms change or worsen he is to call the office or go to the closest emergency department for evaluation.    Amedeo Kinsman, APRN,FNP-BC    A portion of this documentation may have been generated using MMODAL voice recognition software and may contain syntax/voice recognition errors.    I interviewed and examined the patient together with the mid-level provider.  I formulated the assessment and plan for further management.  I agree with the documentation above.    Elam Dutch MD  Interventional Cardiology  12/05/21 20:40

## 2021-11-23 ENCOUNTER — Encounter (HOSPITAL_COMMUNITY)
Admission: RE | Disposition: A | Discharge status: Home / Self Care | Payer: Self-pay | Source: Ambulatory Visit | Attending: INTERVENTIONAL CARDIOLOGY

## 2021-11-23 ENCOUNTER — Other Ambulatory Visit: Payer: Self-pay

## 2021-11-23 ENCOUNTER — Other Ambulatory Visit (INDEPENDENT_AMBULATORY_CARE_PROVIDER_SITE_OTHER): Payer: Self-pay | Admitting: NURSE PRACTITIONER

## 2021-11-23 ENCOUNTER — Inpatient Hospital Stay
Admission: RE | Admit: 2021-11-23 | Discharge: 2021-11-23 | Disposition: A | Discharge status: Home / Self Care | Payer: BC Managed Care – PPO | Source: Ambulatory Visit | Attending: INTERVENTIONAL CARDIOLOGY | Admitting: INTERVENTIONAL CARDIOLOGY

## 2021-11-23 ENCOUNTER — Encounter (HOSPITAL_COMMUNITY): Payer: Self-pay | Admitting: INTERVENTIONAL CARDIOLOGY

## 2021-11-23 DIAGNOSIS — I2510 Atherosclerotic heart disease of native coronary artery without angina pectoris: Secondary | ICD-10-CM | POA: Insufficient documentation

## 2021-11-23 DIAGNOSIS — Z951 Presence of aortocoronary bypass graft: Secondary | ICD-10-CM | POA: Insufficient documentation

## 2021-11-23 DIAGNOSIS — I25810 Atherosclerosis of coronary artery bypass graft(s) without angina pectoris: Secondary | ICD-10-CM | POA: Insufficient documentation

## 2021-11-23 DIAGNOSIS — I252 Old myocardial infarction: Secondary | ICD-10-CM | POA: Insufficient documentation

## 2021-11-23 DIAGNOSIS — I2582 Chronic total occlusion of coronary artery: Secondary | ICD-10-CM | POA: Insufficient documentation

## 2021-11-23 DIAGNOSIS — I2584 Coronary atherosclerosis due to calcified coronary lesion: Secondary | ICD-10-CM | POA: Insufficient documentation

## 2021-11-23 LAB — CBC WITH DIFF
BASOPHIL #: 0.1 10*3/uL (ref 0.00–0.30)
BASOPHIL %: 1 % (ref 0–3)
EOSINOPHIL #: 0.2 10*3/uL (ref 0.00–0.80)
EOSINOPHIL %: 2 % (ref 0–7)
HCT: 45.1 % (ref 42.0–51.0)
HGB: 15.6 g/dL (ref 13.5–18.0)
LYMPHOCYTE #: 2.6 10*3/uL (ref 1.10–5.00)
LYMPHOCYTE %: 30 % (ref 25–45)
MCH: 31.7 pg (ref 27.0–32.0)
MCHC: 34.6 g/dL (ref 32.0–36.0)
MCV: 91.5 fL (ref 78.0–99.0)
MONOCYTE #: 0.7 10*3/uL (ref 0.00–1.30)
MONOCYTE %: 9 % (ref 0–12)
MPV: 8.7 fL (ref 7.4–10.4)
NEUTROPHIL #: 4.9 10*3/uL (ref 1.80–8.40)
NEUTROPHIL %: 58 % (ref 40–76)
PLATELETS: 160 10*3/uL (ref 140–440)
RBC: 4.93 10*6/uL (ref 4.20–6.00)
RDW: 13.1 % (ref 11.6–14.8)
WBC: 8.5 10*3/uL (ref 4.0–10.5)
WBCS UNCORRECTED: 8.5 10*3/uL

## 2021-11-23 LAB — PT/INR
INR: 1.03 (ref <=5.00)
PROTHROMBIN TIME: 12 seconds (ref 9.8–12.7)

## 2021-11-23 LAB — BASIC METABOLIC PANEL
ANION GAP: 10 mmol/L (ref 10–20)
BUN/CREA RATIO: 13 (ref 6–22)
BUN: 22 mg/dL (ref 7–25)
CALCIUM: 8.8 mg/dL (ref 8.6–10.3)
CHLORIDE: 104 mmol/L (ref 98–107)
CO2 TOTAL: 25 mmol/L (ref 21–31)
CREATININE: 1.74 mg/dL — ABNORMAL HIGH (ref 0.60–1.30)
ESTIMATED GFR: 45 mL/min/{1.73_m2} — ABNORMAL LOW (ref >59)
GLUCOSE: 93 mg/dL (ref 74–109)
OSMOLALITY, CALCULATED: 281 mOsm/kg (ref 270–290)
POTASSIUM: 3.5 mmol/L (ref 3.5–5.1)
SODIUM: 139 mmol/L (ref 136–145)

## 2021-11-23 LAB — MAGNESIUM: MAGNESIUM: 1.9 mg/dL (ref 1.9–2.7)

## 2021-11-23 LAB — POC ACT CELITE (RESULTS)
ACT CELITE, POC: 214 seconds
ACT CELITE, POC: 234 seconds

## 2021-11-23 LAB — PTT (PARTIAL THROMBOPLASTIN TIME): APTT: 32.3 seconds (ref 26.0–36.0)

## 2021-11-23 SURGERY — CORONARY ANGIO W/BYPASS GRAFTS
Anesthesia: IV Sedation (Nurse Monitored)

## 2021-11-23 MED ORDER — HEPARIN (PORCINE) (PF) 1,000 UNIT/500 ML IN 0.9 % SODIUM CHLORIDE IV
INTRAVENOUS | Status: AC
Start: 2021-11-23 — End: 2021-11-23
  Filled 2021-11-23: qty 1500

## 2021-11-23 MED ORDER — SODIUM CHLORIDE 0.9 % (FLUSH) INJECTION SYRINGE
3.0000 mL | INJECTION | INTRAMUSCULAR | Status: DC | PRN
Start: 2021-11-23 — End: 2021-11-23

## 2021-11-23 MED ORDER — ASPIRIN 81 MG CHEWABLE TABLET
CHEWABLE_TABLET | ORAL | Status: AC
Start: 2021-11-23 — End: 2021-11-23
  Filled 2021-11-23: qty 4

## 2021-11-23 MED ORDER — SODIUM CHLORIDE 0.9 % INTRAVENOUS SOLUTION
INTRAVENOUS | Status: DC
Start: 2021-11-23 — End: 2021-11-23
  Administered 2021-11-23: 0 mL via INTRAVENOUS

## 2021-11-23 MED ORDER — CLOPIDOGREL 300 MG TABLET
ORAL_TABLET | ORAL | Status: AC
Start: 2021-11-23 — End: 2021-11-23
  Filled 2021-11-23: qty 1

## 2021-11-23 MED ORDER — LIDOCAINE HCL 10 MG/ML (1 %) INJECTION SOLUTION
Freq: Once | INTRAMUSCULAR | Status: DC | PRN
Start: 2021-11-23 — End: 2021-11-23
  Administered 2021-11-23: 10 mL via INTRADERMAL

## 2021-11-23 MED ORDER — CLOPIDOGREL 300 MG TABLET
600.0000 mg | ORAL_TABLET | ORAL | Status: AC
Start: 2021-11-23 — End: 2021-11-23
  Administered 2021-11-23: 300 mg via ORAL

## 2021-11-23 MED ORDER — DIPHENHYDRAMINE 25 MG CAPSULE
25.0000 mg | ORAL_CAPSULE | ORAL | Status: AC
Start: 2021-11-23 — End: 2021-11-23
  Administered 2021-11-23: 25 mg via ORAL

## 2021-11-23 MED ORDER — SODIUM CHLORIDE 0.9 % INTRAVENOUS SOLUTION
INTRAVENOUS | Status: AC | PRN
Start: 2021-11-23 — End: 2021-11-23
  Administered 2021-11-23: 100 mL/h via INTRAVENOUS

## 2021-11-23 MED ORDER — ACETAMINOPHEN 325 MG TABLET
975.0000 mg | ORAL_TABLET | ORAL | Status: AC
Start: 2021-11-23 — End: 2021-11-23
  Administered 2021-11-23: 975 mg via ORAL

## 2021-11-23 MED ORDER — DIAZEPAM 5 MG TABLET
ORAL_TABLET | ORAL | Status: AC
Start: 2021-11-23 — End: 2021-11-23
  Filled 2021-11-23: qty 1

## 2021-11-23 MED ORDER — MIDAZOLAM 5 MG/ML INJECTION WRAPPER
Freq: Once | INTRAMUSCULAR | Status: DC | PRN
Start: 2021-11-23 — End: 2021-11-23
  Administered 2021-11-23 (×5): 1 mg via INTRAVENOUS

## 2021-11-23 MED ORDER — IOHEXOL 350 MG IODINE/ML INTRAVENOUS SOLUTION
Freq: Once | INTRAVENOUS | Status: DC | PRN
Start: 2021-11-23 — End: 2021-11-23
  Administered 2021-11-23: 240 mL via INTRAVENOUS

## 2021-11-23 MED ORDER — ASPIRIN 81 MG CHEWABLE TABLET
324.0000 mg | CHEWABLE_TABLET | Freq: Once | ORAL | Status: AC
Start: 2021-11-23 — End: 2021-11-23
  Administered 2021-11-23: 324 mg via ORAL

## 2021-11-23 MED ORDER — ACETAMINOPHEN 325 MG TABLET
ORAL_TABLET | ORAL | Status: AC
Start: 2021-11-23 — End: 2021-11-23
  Filled 2021-11-23: qty 3

## 2021-11-23 MED ORDER — DIPHENHYDRAMINE 25 MG CAPSULE
ORAL_CAPSULE | ORAL | Status: AC
Start: 2021-11-23 — End: 2021-11-23
  Filled 2021-11-23: qty 1

## 2021-11-23 MED ORDER — SODIUM CHLORIDE 0.9 % (FLUSH) INJECTION SYRINGE
3.0000 mL | INJECTION | Freq: Three times a day (TID) | INTRAMUSCULAR | Status: DC
Start: 2021-11-23 — End: 2021-11-23
  Administered 2021-11-23 (×2): 10 mL

## 2021-11-23 MED ORDER — FENTANYL (PF) 50 MCG/ML INJECTION SOLUTION
INTRAMUSCULAR | Status: AC
Start: 2021-11-23 — End: 2021-11-23
  Filled 2021-11-23: qty 2

## 2021-11-23 MED ORDER — MIDAZOLAM 5 MG/ML INJECTION WRAPPER
INTRAMUSCULAR | Status: AC
Start: 2021-11-23 — End: 2021-11-23
  Filled 2021-11-23: qty 1

## 2021-11-23 MED ORDER — IODIXANOL 320 MG IODINE/ML INTRAVENOUS SOLUTION
Freq: Once | INTRAVENOUS | Status: DC | PRN
Start: 2021-11-23 — End: 2021-11-23
  Administered 2021-11-23: 240 mL via INTRAVENOUS

## 2021-11-23 MED ORDER — HEPARIN (PORCINE) 1,000 UNIT/ML INJECTION SOLUTION
Freq: Once | INTRAMUSCULAR | Status: DC | PRN
Start: 2021-11-23 — End: 2021-11-23
  Administered 2021-11-23: 2000 [IU] via INTRAVENOUS
  Administered 2021-11-23: 6300 [IU] via INTRAVENOUS
  Administered 2021-11-23: 2000 [IU] via INTRAVENOUS

## 2021-11-23 MED ORDER — FENTANYL (PF) 50 MCG/ML INJECTION WRAPPER
INJECTION | Freq: Once | INTRAMUSCULAR | Status: DC | PRN
Start: 2021-11-23 — End: 2021-11-23
  Administered 2021-11-23 (×4): 25 ug via INTRAVENOUS

## 2021-11-23 MED ORDER — DIAZEPAM 5 MG TABLET
5.0000 mg | ORAL_TABLET | ORAL | Status: AC
Start: 2021-11-23 — End: 2021-11-23
  Administered 2021-11-23: 5 mg via ORAL

## 2021-11-23 MED ORDER — LIDOCAINE HCL 10 MG/ML (1 %) INJECTION SOLUTION
INTRAMUSCULAR | Status: AC
Start: 2021-11-23 — End: 2021-11-23
  Filled 2021-11-23: qty 50

## 2021-11-23 MED ORDER — HEPARIN (PORCINE) (PF) 1,000 UNIT/500 ML IN 0.9 % SODIUM CHLORIDE IV
INTRAVENOUS | Status: AC
Start: 2021-11-23 — End: 2021-11-23
  Filled 2021-11-23: qty 1000

## 2021-11-23 SURGICAL SUPPLY — 47 items
ASAHI GRAND SLAM PTCA GW 300CM (WIRE) ×2 IMPLANT
CATH ANGIO 5FR AL1 CURVE 100CM INFNT LRG INNER LUM RADOPQ (CATHETERS) ×1
CATH ANGIO 5FR AL1 CURVE 100CM INFNT LRG INNER LUM RADOPQ SLCT COR FEM VESTAN SS STRL LF  ACPT .038 (CATHETERS) ×1 IMPLANT
CATH ANGIO 5FR IMA CURVE 100CM PERFORMA RADOPQ BRD PERI (CATHETERS) ×2
CATH ANGIO 5FR IMA CURVE 100CM PERFORMA RADOPQ BRD PERI PEBAX PLYCRB NYL STRL ACPT .038IN GW (CATHETERS) ×1 IMPLANT
CATH ANGIO 5FR MPA1 CURVE 100CM PERFORMA RADOPQ BRD PERI (CATHETERS) ×2
CATH ANGIO 5FR MPA1 CURVE 100CM PERFORMA RADOPQ BRD PERI PEBAX PLYCRB NYL STRL ACPT .038IN GW (CATHETERS) ×1 IMPLANT
CATH ANGIO 6FR JL4 CURVE 100CM PERFORMA RADOPQ BRD PERI (CATHETERS) ×1
CATH ANGIO 6FR JL4 CURVE 100CM PERFORMA RADOPQ BRD PERI PEBAX PLYCRB NYL STRL ACPT .038IN GW (CATHETERS) ×1 IMPLANT
CATH ANGIO 6FR JR4 CURVE 100CM PERFORMA RADOPQ BRD PERI (CATHETERS) ×1
CATH ANGIO 6FR JR4 CURVE 100CM PERFORMA RADOPQ BRD PERI PEBAX PLYCRB NYL STRL ACPT .038IN GW (CATHETERS) ×1 IMPLANT
CATH ANGIO 6FR STR PGTL CURVE 110CM PERFORMA 5 SH RADOPQ BRD (CATHETERS) ×1
CATH ANGIO 6FR STR PGTL CURVE 110CM PERFORMA 5 SH RADOPQ BRD CARDIAC PEBAX PLYCRB NYL STRL ACPT .038 (CATHETERS) ×1 IMPLANT
CATH GD 5FR 100CM HSI CURVE LNCHR LRG LUM RADOPQ FLXB DIST (CATHETERS) ×1
CATH GD 5FR 100CM HSI CURVE LNCHR LRG LUM RADOPQ FLXB DIST SEG COR NYL STRL LF (CATHETERS) ×1 IMPLANT
CATH GD 6FR 100CM AL1 CURVE LNCHR LRG LUM RADOPQ FLXB DIST SEG COR NYL STRL LF (CATHETERS) ×1 IMPLANT
CATH GD 6FR 100CM AL1 CURVE LN_CHR LRG LUM RADOPQ FLXB DIST (CATHETERS) ×2
CATH GD 6FR 100CM AR1 CURVE LNCHR LRG LUM RADOPQ FLXB DIST SEG COR NYL STRL LF (CATHETERS) ×1 IMPLANT
CATH GD 6FR 100CM AR1 CURVE LN_CHR LRG LUM RADOPQ FLXB DIST (CATHETERS) ×1
CATH GD 6FR 100CM AR2 CURVE LNCHR LRG LUM RADOPQ FLXB DIST SEG COR NYL STRL LF (CATHETERS) ×1 IMPLANT
CATH GD 6FR 100CM AR2 CURVE LN_CHR LRG LUM RADOPQ FLXB DIST (CATHETERS) ×2
CATH GD 6FR 150CM GUIDEZILLA II EXT: RADOPQ HYPOTUBE SHAFT ATRAUMA TIP SS PLMR HDRPH (CATHETERS) ×1 IMPLANT
CATH GD 6FR 150CM GUIDEZILLA I_I RADOPQ HYPOTUBE SHFT EXT (CATHETERS) ×1
CATH MINI TREK SLIM SEAL 2MM 20MM RX ULTRA LOW PROF RADOPQ FLXB DIST SHAFT BAL DIL COR TUNG PTCA (BALLOON) ×1 IMPLANT
CATH MINI TREK SLIM SL 2MM 20M_M RX ULTRA LO PRFL RADOPQ (BALLOON) ×1
CATH TREK SLIM SEAL 2.5MM 30MM RX ULTRA LOW PROF RADOPQ FLXB (CATHETERS) ×1
CATH TREK SLIM SEAL 2.5MM 30MM RX ULTRA LOW PROF RADOPQ FLXB DIST SHAFT BAL DIL COR CROSSFLEX2 TUNG (CATHETERS) ×1 IMPLANT
CATH TREK SLIM SEAL 3MM 25MM RX ULTRA LOW PROF RADOPQ FLXB (CATHETERS) ×1
CATH TREK SLIM SEAL 3MM 25MM RX ULTRA LOW PROF RADOPQ FLXB DIST SHAFT BAL DIL COR CROSSFLEX2 TUNG (CATHETERS) ×1 IMPLANT
COR STENT XIENCE SKYPOINT MTLNK 4MM 15MM 145CM DEL SYS PEBAX EVEROLIMUS COCR FLUORINATE CPLYMR SYS (STENTS CORONARY) ×1 IMPLANT
COR STENT XIENCE SKYPOINT MTLNK 4MM 33MM 145CM DEL SYS PEBAX EVEROLIMUS COCR FLUORINATE CPLYMR SYS (STENTS CORONARY) ×1 IMPLANT
DEVICE CLSR CORDIS MYNXGRIP 6-7FR BAL CATH INTGR SEAL LOCK SYRG ATRAUMA TIP GRN 10ML VAS STRL LF (IMPLANTS CARDIOTHORACIC) ×1 IMPLANT
GW .035IN 260CM ANGIO PTFE FIX COR STD BODY STN FNSH 2 END 3MM J CURVE STRL (WIRE) ×1 IMPLANT
GW GRND SLAM .014IN 4CM 180CM RADOPQ SPRG COIL SIL HDRPHB VAS STR PTCA PTA (WIRE) ×1 IMPLANT
GW GRND SLAM .014IN 4CM 180CM_PRG COIL RADOPQ SLIP-COAT (WIRE) ×1
GW INQWR .035IN 150CM FIX COR FINGER STRG PTFE VAS 3MM RAD STD J STRL LF (WIRE) ×1 IMPLANT
GW INQWR .035IN 150CM FIX COR_NGR STRG STD SHFT PTFE VAS 3 (WIRE) ×1
GW INQWR .035IN 260CM FIX COR_FINGER STRG STD SHAFT PTFE VAS (WIRE) ×1
GW RUNTHROUGH .36MM 3CM 180CM RADOPQ X FLPY VAS STRL LF  DISP PTCA (WIRE) ×2 IMPLANT
GW RUNTHROUGH NS .014IN 3CM 18_CM ATRM X FLPY TIP RADOPQ (WIRE) ×2
KIT INTROD 10CM 4FR 21GA MRT MK .018IN NITINOL GW PLAT TIP (VASCULAR) ×1
KIT INTROD 10CM 4FR 21GA MRT MK .018IN NITINOL GW PLAT TIP ECHO ENH NEEDLE MINI ACCESS 7CM (VASCULAR) ×1 IMPLANT
MCTH INFUS 135CM 2.3FR 3.3FR NHANCER RX HDRPH 2 LUM SFT TAPER TIP REINF SHAFT RADOPQ ACPT 5FR GUIDE (CATHETERS) ×1 IMPLANT
NHANCER RX 5F 135CM NRX1413518 (CATHETERS) ×1
SET ADMN MERIT MEDICAL SYS INJ MED CNRST S DISP (VASCULAR) ×2 IMPLANT
SHEATH INTROD 10CM 6FR PINN COR SS HDRPH PTFE SNAPON DIL LOCK KINK RST SMOOTH TRNS 2.5CM ACPT .038IN (INTRODUCER) ×1 IMPLANT
SHEATH SUPER 6FR 11CM .035_RSS602 10EA/BX (INTRODUCER) ×1

## 2021-11-23 NOTE — Nurses Notes (Signed)
Patient states back pain has improved. Rates pain 2/10 on 0/10 pain scale. Eating early dinner without any complaints or problems.

## 2021-11-23 NOTE — Nurses Notes (Signed)
Dr. Elesa Massed in to see patient and checked femoral site. No problems noted.

## 2021-11-23 NOTE — Nurses Notes (Signed)
Dr. Elesa Massed in to see patient. Instructed to increase IV fluids to 100 cc/hr.

## 2021-11-23 NOTE — Nurses Notes (Signed)
..  [facility] PRE/POST CARDIAC CATHETHETERIZATION INFORMATION  PROVIDED TO PATIENT. INFORMATION INCLUDING CORONARY STENT, HAVING CARDIAC CATHETRIZATION, DISCHARGE INSTRUCTIONS FOR CARDIAC CATHETERIZATION, DISCHARGE INSTRUCTIONS FOR CORONARY ANGIOPLASTY AND STENTING. UNDERSTANDING VERBALIZED.       PT PROVIDED WITH INFORMATION FOR POST IV CONTRAST MEDIA INSTRUCTIONS.  PT DOES HAVE A HISTORY OF HYPERTENSION. PT VERBALIZES UNDERSTANDING TO CONSUME PLENTY OF WATER TO FLUSH IV CONTRAST FROM KIDNEYS.     HOME MEDICATIONS REVIEWED WITH PT. ALL HOME MEDICATIONS AND DOSAGES VERIFIED WITH PT.         POST CATH DISCHARGE INSTRUCTIONS REVIEWED WITH PT. PT AWARE TO:      ~CHECK COLOR, TEMP, AND CIRCULATION OF RIGHT GROIN AND LEG AT LEAST EVERY 8 HOURS      ~NO DRIVING, OPERATING HEAVY MACHINERY/EQUIPMENT OR POWER TOOLS FOR  48   HOURS      ~NOT TO MAKE IMPORTANT DECISIONS FOR 24 HOURS      ~IF BLEEDING, PERSISTENT NAUSEA/VOMITING UNUSUAL PAIN, SWELLING OR FEVER OCCUR, NOTIFY PHYSICIAN OR CALL 911      ~NO LIFTING ANYTHING OVER 5    POUNDS FOR  1 WEEK     ~MAY SHOWER IN 24 HOURS, DRESSING MAY BE REMOVED IN 24 HOURS      ~CLEAN SITE DAILY WITH MILD SOAP AND WATER TO DECREASE INFECTION RISK, KEEP SITE CLEAN AND DRY      ~NO LAWN MOWER, MOTORCYCLE, CHAINSAW OR ATV FOR 48 HOURS      ~DRINK AT LEAST 8-8 OUNCE GLASSES OF WATER A DAY FOR 24 HOURS       WILL RETURN TO CATH LAB FOR STAGED PCI IN A COUPLE OF WEEKS. IF HAS NOT HEARD FROM DR. WARD'S OFFICE. CALL [phone]     STENT CARD AND STENT BOOKLET PROVIDED      ALL INSTRUCTIONS REVIEWED WITH PT.

## 2021-11-23 NOTE — Nurses Notes (Signed)
PT UP TO BATHROOM, STANDBY ASSIST, GAIT STEADY, NO DIZZINESS

## 2021-11-23 NOTE — Nurses Notes (Signed)
Patient complaining of back pain. Repositioned in bed after he voided 500 cc's. Liquids being taken. Rates back pain 4 on 0/10 pain scale. Medicated with Tylenol 975 mg po.

## 2021-11-23 NOTE — Nurses Notes (Signed)
Patient resting quietly in bed in Cath Lab holding room #2. Denies any complaints of pain or discomfort. Discussed his results of his kidney function from blood drawn this am. States he has had problems with his labs indicating kidney problems for years. Informed will discuss with Dr. Leonides Schanz and possibly use Visapaque contrast to help protect his kidney function. Patient voices understanding.

## 2021-11-23 NOTE — Nurses Notes (Signed)
Patient's sister in to see patient. Informed staff going out to baseball game. Will call around 7pm to see what time he will be discharged.

## 2021-11-23 NOTE — H&P (Signed)
Gabriel Best      H&P UPDATE FORM                                                                                  Gabriel Best, Gabriel Decamp., 61 y.o. male  Date of Admission:  11/23/2021  Date of Birth:  02/12/1961    11/23/2021    STOP: IF H&P IS GREATER THAN 30 DAYS FROM SURGICAL DAY COMPLETE NEW H&P IS REQUIRED.     H & P updated the day of the procedure.  1.  H&P completed within 30 days of surgical procedure and has been reviewed within 24 hours of admission but prior to surgery or a procedure requiring anesthesia services, the patient has been examined, and no change has occured in the patients condition since the H&P was completed.              Change in medications: No                Comments: see full H&P in chart review    2.  Patient continues to be appropriate candidate for planned surgical procedure. YES    3. HPI: 61 D Gabriel Best. is a very pleasant 61 y.o. male with a past medical history significant for coronary artery disease.  He had four-vessel bypass at age 68 after MI performed at [facility]. In 2013, the patient had a non-ST elevation MI and underwent cardiac catheterization by Dr. Bari Edward at [facility] in [place].  He was found to have an occlusion in the vein graft to the left circumflex, which was borderline stenosis 60% to 70%.  There was 75% stenosis in the mid RCA that had a patent vein graft and there was also patent graft to the diagonal.  Stress test in 2018 for preoperative assessment was low risk study.  EF was 64% on resting imaging.  There was no TID.  There was a moderate size apical perfusion defect that was largely fixed consistent with previous defect just slightly extended.  Repeat stress test in May 2022 was a normal, low risk study, normal myocardial perfusion imaging.  EF was slightly reduced to 46%. Echocardiogram in March 2022 showed EF 45% with moderate aortic insufficiency. Echo 08/17/21 showed EF 45% with moderate AI. Patient had worsening  symptoms recently with midsternal chest pain with radiate to both sides of chest, similar to reflux symptoms.  He was seen in ER with unremarkable cardiac workup, started on reflux medications, but symptoms persisted.  His last cath was in 2013.  We recommended left heart catheterization for further evaluation of his symptoms.      Gabriel Kinsman, APRN,FNP-BC

## 2021-11-23 NOTE — Nurses Notes (Signed)
PT HAS COMPLETED BEDREST WITH NO S/S BLEEDING, OOZING OR HEMATOMA. PT'S VITALS HAVE REMAINED STABLE. IV FLUIDS HAVE BEEN DISCONTINED. IV HAS BEEN REMOVED WITH CATHETER INTACT. NO S/S IV RELATED COMPLICATIONS. PRESSURE DRESSING PLACED. CATH SITE REMAINS SOFT WITH NO S/S BLEEDING, OOZING OR HEMATOMA NOTED. COLOR, TEMPERATURE, SENSATION REMAINED EQUAL BILATERALLY. CAP REFILL < 2 SECONDS BILATERALLY. PULSES 2+ WITH NO CHANGES FROM INITIAL ASSESSMENT. PT DRESSED WITH MINIMAL TO NO ASSIST. PT TRANSPORTED VIA WHEELCHAIR TO FRONT LOBBY BY CATH LAB STAFF WITH ALL POSSESSIONS INCLUDING CELL PHONE, MEDICATIONS AND CLOTHING. PT DISCHARGED TO HOME CARE OF FAMILY, DRIVEN HOME BY FAMILY VIA PERSONAL CAR. DENIES QUESTIONS  OF DISCHARGE INSTRUCTIONS. .

## 2021-11-23 NOTE — Nurses Notes (Signed)
Stent and Mynx card given and reviewed in detail with patient. Voiced understanding.

## 2021-11-23 NOTE — Nurses Notes (Signed)
AVS, MEDICATIONS, AND DISCHARGE INSTRUCTIONS REVIEWED WITH PT, PT DENIES QUESTIONS. DENIES ANY QUESTIONS

## 2021-11-23 NOTE — Discharge Instructions (Addendum)
..  [facility] PRE/POST CARDIAC CATHETHETERIZATION INFORMATION  PROVIDED TO PATIENT. INFORMATION INCLUDING CORONARY STENT, HAVING CARDIAC CATHETRIZATION, DISCHARGE INSTRUCTIONS FOR CARDIAC CATHETERIZATION, DISCHARGE INSTRUCTIONS FOR CORONARY ANGIOPLASTY AND STENTING. UNDERSTANDING VERBALIZED.       PT PROVIDED WITH INFORMATION FOR POST IV CONTRAST MEDIA INSTRUCTIONS.  PT DOES HAVE A HISTORY OF HYPERTENSION. PT VERBALIZES UNDERSTANDING TO CONSUME PLENTY OF WATER TO FLUSH IV CONTRAST FROM KIDNEYS.     HOME MEDICATIONS REVIEWED WITH PT. ALL HOME MEDICATIONS AND DOSAGES VERIFIED WITH PT.         POST CATH DISCHARGE INSTRUCTIONS REVIEWED WITH PT. PT AWARE TO:      ~CHECK COLOR, TEMP, AND CIRCULATION OF RIGHT GROIN AND LEG AT LEAST EVERY 8 HOURS      ~NO DRIVING, OPERATING HEAVY MACHINERY/EQUIPMENT OR POWER TOOLS FOR  48   HOURS      ~NOT TO MAKE IMPORTANT DECISIONS FOR 24 HOURS      ~IF BLEEDING, PERSISTENT NAUSEA/VOMITING UNUSUAL PAIN, SWELLING OR FEVER OCCUR, NOTIFY PHYSICIAN OR CALL 911      ~NO LIFTING ANYTHING OVER 5    POUNDS FOR  1 WEEK     ~MAY SHOWER IN 24 HOURS, DRESSING MAY BE REMOVED IN 24 HOURS      ~CLEAN SITE DAILY WITH MILD SOAP AND WATER TO DECREASE INFECTION RISK, KEEP SITE CLEAN AND DRY      ~NO LAWN MOWER, MOTORCYCLE, CHAINSAW OR ATV FOR 48 HOURS      ~DRINK AT LEAST 8-8 OUNCE GLASSES OF WATER A DAY FOR 24 HOURS       WILL RETURN TO CATH LAB FOR STAGED PCI IN A COUPLE OF WEEKS. IF HAS NOT HEARD FROM DR. WARD'S OFFICE. CALL [phone]     STENT CARD AND STENT BOOKLET PROVIDED      ALL INSTRUCTIONS REVIEWED WITH PT.

## 2021-12-05 ENCOUNTER — Encounter (HOSPITAL_COMMUNITY): Payer: Self-pay | Admitting: Physician Assistant

## 2021-12-12 ENCOUNTER — Telehealth (HOSPITAL_COMMUNITY): Payer: Self-pay

## 2021-12-13 ENCOUNTER — Encounter (HOSPITAL_COMMUNITY): Payer: Self-pay | Admitting: INTERVENTIONAL CARDIOLOGY

## 2021-12-13 ENCOUNTER — Inpatient Hospital Stay
Admission: RE | Admit: 2021-12-13 | Discharge: 2021-12-13 | Disposition: A | Discharge status: Home / Self Care | Payer: BC Managed Care – PPO | Source: Ambulatory Visit | Attending: INTERVENTIONAL CARDIOLOGY | Admitting: INTERVENTIONAL CARDIOLOGY

## 2021-12-13 ENCOUNTER — Other Ambulatory Visit: Payer: Self-pay

## 2021-12-13 ENCOUNTER — Encounter (HOSPITAL_COMMUNITY)
Admission: RE | Disposition: A | Discharge status: Home / Self Care | Payer: Self-pay | Source: Ambulatory Visit | Attending: INTERVENTIONAL CARDIOLOGY

## 2021-12-13 DIAGNOSIS — I219 Acute myocardial infarction, unspecified: Secondary | ICD-10-CM

## 2021-12-13 DIAGNOSIS — I255 Ischemic cardiomyopathy: Secondary | ICD-10-CM

## 2021-12-13 DIAGNOSIS — I351 Nonrheumatic aortic (valve) insufficiency: Secondary | ICD-10-CM | POA: Insufficient documentation

## 2021-12-13 DIAGNOSIS — I2510 Atherosclerotic heart disease of native coronary artery without angina pectoris: Secondary | ICD-10-CM | POA: Insufficient documentation

## 2021-12-13 DIAGNOSIS — I25119 Atherosclerotic heart disease of native coronary artery with unspecified angina pectoris: Secondary | ICD-10-CM

## 2021-12-13 DIAGNOSIS — Z951 Presence of aortocoronary bypass graft: Secondary | ICD-10-CM | POA: Insufficient documentation

## 2021-12-13 DIAGNOSIS — I2584 Coronary atherosclerosis due to calcified coronary lesion: Secondary | ICD-10-CM | POA: Insufficient documentation

## 2021-12-13 DIAGNOSIS — I252 Old myocardial infarction: Secondary | ICD-10-CM | POA: Insufficient documentation

## 2021-12-13 DIAGNOSIS — I2089 Other forms of angina pectoris: Secondary | ICD-10-CM

## 2021-12-13 DIAGNOSIS — I208 Other forms of angina pectoris: Secondary | ICD-10-CM

## 2021-12-13 HISTORY — DX: Gastro-esophageal reflux disease without esophagitis: K21.9

## 2021-12-13 HISTORY — DX: Disorder of kidney and ureter, unspecified: N28.9

## 2021-12-13 LAB — CBC WITH DIFF
BASOPHIL #: 0.1 10*3/uL (ref 0.00–0.30)
BASOPHIL %: 1 % (ref 0–3)
EOSINOPHIL #: 0.2 10*3/uL (ref 0.00–0.80)
EOSINOPHIL %: 2 % (ref 0–7)
HCT: 44.2 % (ref 42.0–51.0)
HGB: 15 g/dL (ref 13.5–18.0)
LYMPHOCYTE #: 2.6 10*3/uL (ref 1.10–5.00)
LYMPHOCYTE %: 34 % (ref 25–45)
MCH: 31.1 pg (ref 27.0–32.0)
MCHC: 33.9 g/dL (ref 32.0–36.0)
MCV: 91.8 fL (ref 78.0–99.0)
MONOCYTE #: 0.8 10*3/uL (ref 0.00–1.30)
MONOCYTE %: 10 % (ref 0–12)
MPV: 8.6 fL (ref 7.4–10.4)
NEUTROPHIL #: 4 10*3/uL (ref 1.80–8.40)
NEUTROPHIL %: 52 % (ref 40–76)
PLATELETS: 138 10*3/uL — ABNORMAL LOW (ref 140–440)
RBC: 4.82 10*6/uL (ref 4.20–6.00)
RDW: 13.4 % (ref 11.6–14.8)
WBC: 7.6 10*3/uL (ref 4.0–10.5)
WBCS UNCORRECTED: 7.6 10*3/uL

## 2021-12-13 LAB — BASIC METABOLIC PANEL
ANION GAP: 8 mmol/L — ABNORMAL LOW (ref 10–20)
BUN/CREA RATIO: 15 (ref 6–22)
BUN: 25 mg/dL (ref 7–25)
CALCIUM: 8.8 mg/dL (ref 8.6–10.3)
CHLORIDE: 108 mmol/L — ABNORMAL HIGH (ref 98–107)
CO2 TOTAL: 20 mmol/L — ABNORMAL LOW (ref 21–31)
CREATININE: 1.63 mg/dL — ABNORMAL HIGH (ref 0.60–1.30)
ESTIMATED GFR: 48 mL/min/{1.73_m2} — ABNORMAL LOW (ref >59)
GLUCOSE: 95 mg/dL (ref 74–109)
OSMOLALITY, CALCULATED: 276 mOsm/kg (ref 270–290)
POTASSIUM: 4.5 mmol/L (ref 3.5–5.1)
SODIUM: 136 mmol/L (ref 136–145)

## 2021-12-13 LAB — PTT (PARTIAL THROMBOPLASTIN TIME): APTT: 30.9 seconds (ref 26.0–36.0)

## 2021-12-13 LAB — MAGNESIUM: MAGNESIUM: 1.9 mg/dL (ref 1.9–2.7)

## 2021-12-13 LAB — PT/INR
INR: 1.04 (ref <=5.00)
PROTHROMBIN TIME: 12.1 seconds (ref 9.8–12.7)

## 2021-12-13 SURGERY — CORONARY ARTERY ANGIOPLASTY - INITIAL VESSEL
Anesthesia: IV Sedation (Nurse Monitored)

## 2021-12-13 MED ORDER — BIVALIRUDIN 250 MG INTRAVENOUS POWDER FOR SOLUTION
INTRAVENOUS | Status: AC
Start: 2021-12-13 — End: 2021-12-13
  Filled 2021-12-13: qty 5

## 2021-12-13 MED ORDER — SODIUM CHLORIDE 0.9 % INTRAVENOUS SOLUTION
INTRAVENOUS | Status: DC
Start: 2021-12-13 — End: 2021-12-13

## 2021-12-13 MED ORDER — ASPIRIN 81 MG CHEWABLE TABLET
CHEWABLE_TABLET | ORAL | Status: AC
Start: 2021-12-13 — End: 2021-12-13
  Filled 2021-12-13: qty 4

## 2021-12-13 MED ORDER — SODIUM CHLORIDE 0.9 % INTRAVENOUS SOLUTION
INTRAVENOUS | Status: AC | PRN
Start: 2021-12-13 — End: 2021-12-13
  Administered 2021-12-13: 75 mL/h via INTRAVENOUS

## 2021-12-13 MED ORDER — MIDAZOLAM 5 MG/ML INJECTION WRAPPER
INTRAMUSCULAR | Status: AC
Start: 2021-12-13 — End: 2021-12-13
  Filled 2021-12-13: qty 1

## 2021-12-13 MED ORDER — CLOPIDOGREL 300 MG TABLET
ORAL_TABLET | Freq: Once | ORAL | Status: DC | PRN
Start: 2021-12-13 — End: 2021-12-13
  Administered 2021-12-13: 300 mg via ORAL

## 2021-12-13 MED ORDER — IODIXANOL 320 MG IODINE/ML INTRAVENOUS SOLUTION
Freq: Once | INTRAVENOUS | Status: DC | PRN
Start: 2021-12-13 — End: 2021-12-13
  Administered 2021-12-13: 275 mL via INTRA_ARTERIAL

## 2021-12-13 MED ORDER — LIDOCAINE HCL 10 MG/ML (1 %) INJECTION SOLUTION
Freq: Once | INTRAMUSCULAR | Status: DC | PRN
Start: 2021-12-13 — End: 2021-12-13
  Administered 2021-12-13: 10 mL via INTRADERMAL

## 2021-12-13 MED ORDER — BIVALIRUDIN 5 MG/ML BOLUS FROM INFUSION
Freq: Once | INTRAVENOUS | Status: DC | PRN
Start: 2021-12-13 — End: 2021-12-13
  Administered 2021-12-13: 68.025 mg via INTRAVENOUS

## 2021-12-13 MED ORDER — FENTANYL (PF) 50 MCG/ML INJECTION WRAPPER
INJECTION | Freq: Once | INTRAMUSCULAR | Status: DC | PRN
Start: 2021-12-13 — End: 2021-12-13
  Administered 2021-12-13 (×4): 50 ug via INTRAVENOUS

## 2021-12-13 MED ORDER — MIDAZOLAM 5 MG/ML INJECTION WRAPPER
Freq: Once | INTRAMUSCULAR | Status: DC | PRN
Start: 2021-12-13 — End: 2021-12-13
  Administered 2021-12-13: 2 mg via INTRAVENOUS

## 2021-12-13 MED ORDER — ACETAMINOPHEN 325 MG TABLET
975.0000 mg | ORAL_TABLET | Freq: Once | ORAL | Status: DC | PRN
Start: 2021-12-13 — End: 2021-12-13
  Administered 2021-12-13: 975 mg via ORAL

## 2021-12-13 MED ORDER — BIVALIRUDIN 250 MG INTRAVENOUS POWDER FOR SOLUTION
INTRAVENOUS | Status: DC | PRN
Start: 2021-12-13 — End: 2021-12-13
  Administered 2021-12-13: 1.75 mg/kg/h via INTRAVENOUS
  Administered 2021-12-13: 0 via INTRAVENOUS
  Administered 2021-12-13: 1.75 mg/kg/h via INTRAVENOUS

## 2021-12-13 MED ORDER — ACETAMINOPHEN 325 MG TABLET
ORAL_TABLET | ORAL | Status: AC
Start: 2021-12-13 — End: 2021-12-13
  Filled 2021-12-13: qty 3

## 2021-12-13 MED ORDER — CLOPIDOGREL 300 MG TABLET
ORAL_TABLET | ORAL | Status: AC
Start: 2021-12-13 — End: 2021-12-13
  Filled 2021-12-13: qty 1

## 2021-12-13 MED ORDER — MIDAZOLAM 5 MG/ML INJECTION WRAPPER
Freq: Once | INTRAMUSCULAR | Status: DC | PRN
Start: 2021-12-13 — End: 2021-12-13
  Administered 2021-12-13 (×2): 2 mg via INTRAVENOUS
  Administered 2021-12-13: 1 mg via INTRAVENOUS
  Administered 2021-12-13 (×2): 2 mg via INTRAVENOUS

## 2021-12-13 MED ORDER — ASPIRIN 81 MG CHEWABLE TABLET
324.0000 mg | CHEWABLE_TABLET | Freq: Once | ORAL | Status: AC
Start: 2021-12-13 — End: 2021-12-13
  Administered 2021-12-13: 324 mg via ORAL

## 2021-12-13 MED ORDER — DIAZEPAM 5 MG TABLET
5.0000 mg | ORAL_TABLET | ORAL | Status: AC
Start: 2021-12-13 — End: 2021-12-13
  Administered 2021-12-13: 5 mg via ORAL

## 2021-12-13 MED ORDER — HEPARIN (PORCINE) (PF) 2,000 UNIT/1,000 ML IN 0.9 % SODIUM CHLORIDE IV
INTRAVENOUS | Status: AC
Start: 2021-12-13 — End: 2021-12-13
  Filled 2021-12-13: qty 1000

## 2021-12-13 MED ORDER — FENTANYL (PF) 50 MCG/ML INJECTION SOLUTION
INTRAMUSCULAR | Status: AC
Start: 2021-12-13 — End: 2021-12-13
  Filled 2021-12-13: qty 2

## 2021-12-13 MED ORDER — LIDOCAINE HCL 10 MG/ML (1 %) INJECTION SOLUTION
INTRAMUSCULAR | Status: AC
Start: 2021-12-13 — End: 2021-12-13
  Filled 2021-12-13: qty 50

## 2021-12-13 MED ORDER — DIPHENHYDRAMINE 25 MG CAPSULE
50.0000 mg | ORAL_CAPSULE | ORAL | Status: AC
Start: 2021-12-13 — End: 2021-12-13
  Administered 2021-12-13: 25 mg via ORAL

## 2021-12-13 MED ORDER — SODIUM CHLORIDE 0.9 % INTRAVENOUS SOLUTION
INTRAVENOUS | Status: DC
Start: 2021-12-13 — End: 2021-12-13
  Administered 2021-12-13: 0 mL via INTRAVENOUS

## 2021-12-13 MED ORDER — DIAZEPAM 5 MG TABLET
ORAL_TABLET | ORAL | Status: AC
Start: 2021-12-13 — End: 2021-12-13
  Filled 2021-12-13: qty 1

## 2021-12-13 MED ORDER — HEPARIN (PORCINE) (PF) 2,000 UNIT/1,000 ML IN 0.9 % SODIUM CHLORIDE IV
INTRAVENOUS | Status: AC
Start: 2021-12-13 — End: 2021-12-13
  Filled 2021-12-13: qty 2000

## 2021-12-13 MED ORDER — DIPHENHYDRAMINE 25 MG CAPSULE
ORAL_CAPSULE | ORAL | Status: AC
Start: 2021-12-13 — End: 2021-12-13
  Filled 2021-12-13: qty 1

## 2021-12-13 MED ORDER — MIDAZOLAM 5 MG/ML INJECTION WRAPPER
Freq: Once | INTRAMUSCULAR | Status: DC | PRN
Start: 2021-12-13 — End: 2021-12-13
  Administered 2021-12-13: 1 mg via INTRAVENOUS

## 2021-12-13 SURGICAL SUPPLY — 49 items
CATH GD 6FR 100CM AL1 CURVE LNCHR LRG LUM RADOPQ FLXB DIST SEG COR NYL STRL LF (CATHETERS) ×1 IMPLANT
CATH GD 6FR 100CM AL1 CURVE LN_CHR LRG LUM RADOPQ FLXB DIST (CATHETERS) ×2
CATH GD 6FR 150CM GUIDEZILLA II EXT: RADOPQ HYPOTUBE SHAFT ATRAUMA TIP SS PLMR HDRPH (CATHETERS) ×1 IMPLANT
CATH GD 6FR 150CM GUIDEZILLA I_I RADOPQ HYPOTUBE SHFT EXT (CATHETERS) ×2
CATH GD 8FR 100CM RBU3.5 CURVE LNCHR LRG LUM SH RADOPQ FLXB DIST SEG COR NYL STRL LF (CATHETERS) ×1 IMPLANT
CATH GD 8FR 100CM RBU3.5 CURVE_LNCHR LRG LUM SH RADOPQ FLXB (CATHETERS) ×2
CATH GD 8FR 100CM STD JR4 CURVE LNCHR LRG LUM RADOPQ FLXB DIST SEG COR NYL STRL LF (CATHETERS) ×1 IMPLANT
CATH GD 8FR 100CM STD JR4 CURV_E LNCHR LRG LUM RADOPQ FLXB (CATHETERS) ×2
CATH NC EUPHORA 3MM 12MM 142CM RX LOW PROF RADOPQ BAL DIL DURA-TRC NYL LF  PTCA (BALLOON) ×1 IMPLANT
CATH NC EUPHORA 3MM 12MM 142CM_RX LO PRFL RADOPQ BAL DIL (BALLOON) ×2
CATH NC EUPHORA 3MM 15MM 142CM RX LOW PROF RADOPQ BAL DIL DURA-TRC NYL LF  PTCA (BALLOON) ×1 IMPLANT
CATH NC EUPHORA 3MM 15MM 142CM_RX LO PRFL RADOPQ BAL DIL (BALLOON) ×2
CATH NC EUPHORA 4MM 15MM 142CM RX LOW PROF RADOPQ BAL DIL DURA-TRC NYL LF  PTCA (BALLOON) ×1 IMPLANT
CATH NC EUPHORA 4MM 15MM 142CM_RX LO PRFL RADOPQ BAL DIL (BALLOON) ×2
CATH SPRINTR LGND MCROBRT 1.25MM 15MM 142CM 21CM .02IN RX CX PROF BAL DIL FULCRUM DURA-TRC LF  ACPT (BALLOON) ×1 IMPLANT
CATH SPRINTR LGND MCROBRT 1.25_MM 15MM 142CM 21CM .02IN RX CX (BALLOON) ×2
CATH SPRINTR LGND MCROBRT 1.5MM 12MM 142CM 21CM RX CX PROF BAL DIL FULCRUM DURA-TRC LF  ACPT .056IN (BALLOON) ×1 IMPLANT
CATH SPRINTR LGND MCROBRT 1.5M_M 12MM 142CM 21CM .02IN RX CX (BALLOON) ×2
CATH SPRINTR LGND MCROBRT 2.25MM 15MM 142CM 21CM .022IN RX (BALLOON) ×4
CATH SPRINTR LGND MCROBRT 2.25MM 15MM 142CM 21CM .022IN RX CX PROF BAL DIL FULCRUM DURA-TRC LF  ACPT (BALLOON) ×2 IMPLANT
CATH SPRINTR LGND MCROBRT 2MM 20MM 142CM 21CM .022IN RX CX (BALLOON) ×2
CATH SPRINTR LGND MCROBRT 2MM 20MM 142CM 21CM .022IN RX CX PROF BAL DIL FULCRUM DURA-TRC LF  ACPT (BALLOON) ×1 IMPLANT
CATH SPRINTR LGND MCROBRT 3.5MM 15MM 142CM 21CM .023IN RX CX PROF BAL DIL FULCRUM DURA-TRC ACPT .056 (BALLOON) ×1 IMPLANT
CATH SPRINTR LGND MCROBRT 3.5M_M 15MM 142CM 21CM .023IN RX CX (BALLOON) ×2
CATH SPRINTR LGND MCROBRT 3MM 15MM 142CM 21CM .023IN RX CX PROF BAL DIL FULCRUM DURA-TRC ACPT .056IN (BALLOON) ×1 IMPLANT
CATH SPRINTR LGND MCROBRT 3MM_15MM 142CM 21CM .023IN RX CX (BALLOON) ×2
CATH US EE PLAT 3.3-2.9FR 20MM 150CM 24CM 20MHZ RX LUM RADOPQ TAPER TIP GLYDX ACPT .014IN GW TRKBK (CATHETERS) ×1 IMPLANT
CATH US EE PLAT 3.3-2.9FR 20MM_150CM 24CM 20MHZ RX LUM (CATHETERS) ×2
DEVICE CLSR ANGIOSEAL VIP BP+ 8FR .038IN HMST BIOABS INSERTION SHEATH GW POLYGLYD VAS (IMPLANTS CARDIOTHORACIC) ×1 IMPLANT
GW .035IN 260CM ANGIO PTFE FIX COR STD BODY STN FNSH 2 END 3MM J CURVE STRL (WIRE) ×1 IMPLANT
GW GRND SLAM .014IN 4CM 180CM RADOPQ SPRG COIL SIL HDRPHB VAS STR PTCA PTA (WIRE) ×1 IMPLANT
GW GRND SLAM .014IN 4CM 180CM_PRG COIL RADOPQ SLIP-COAT (WIRE) ×2
GW HITRQ ALL STAR .014IN 3CM 190CM VAS STR (WIRE) ×2 IMPLANT
GW HITRQ VERSATURN .014IN 3CM 190CM RADOPQ COR TO TIP BARE COIL ELASTINITE HDRPH VAS COR STR STRL LF (WIRE) ×1 IMPLANT
GW HITRQ VERSATURN .014IN 3CM_90CM COR TO TIP RADOPQ KINK (WIRE) ×2
GW INQWR .035IN 150CM FIX COR FINGER STRG PTFE VAS 3MM RAD STD J STRL LF (WIRE) ×1 IMPLANT
GW INQWR .035IN 150CM FIX COR_NGR STRG STD SHFT PTFE VAS 3 (WIRE) ×2
GW INQWR .035IN 260CM FIX COR_FINGER STRG STD SHAFT PTFE VAS (WIRE) ×2
GW RUNTHROUGH .36MM 3CM 180CM RADOPQ X FLPY VAS STRL LF  DISP PTCA (WIRE) ×3 IMPLANT
GW RUNTHROUGH NS .014IN 3CM 18_CM ATRM X FLPY TIP RADOPQ (WIRE) ×6
KIT INTROD 10CM 4FR 21GA MRT MK .018IN NITINOL GW PLAT TIP (VASCULAR) ×2
KIT INTROD 10CM 4FR 21GA MRT MK .018IN NITINOL GW PLAT TIP ECHO ENH NEEDLE MINI ACCESS 7CM (VASCULAR) ×1 IMPLANT
SET ADMN MERIT MEDICAL SYS INJ MED CNRST S DISP (VASCULAR) ×2 IMPLANT
SHEATH GD 10CM 8FR PINN .038IN COR SS SNAPON DIL LOCK KINK RST SMOOTH TRNS SPRG COIL GW 2.5CM (INTRODUCER) ×1 IMPLANT
SHEATH SUPER 8FR 11CM .035_RSS802 10EA/BX (INTRODUCER) ×2
STENT ONYX FRNTR 2.25MM 12MM COR RX STRL LF (STENTS CORONARY) ×1 IMPLANT
STENT ONYX FRNTR 3.5MM 15MM COR RX STRL LF (STENTS CORONARY) ×1 IMPLANT
STENT ONYX FRNTR 3.5MM 22MM COR RX STRL LF (STENTS CORONARY) IMPLANT
STENT ONYX FRNTR 3MM 22MM COR RX STRL LF (STENTS CORONARY) ×1 IMPLANT

## 2021-12-13 NOTE — Discharge Instructions (Addendum)
Gabriel PRE/POST CARDIAC CATHETHETERIZATION INFORMATION PACKET PROVIDED TO PATIENT. INFORMATION INCLUDING CORONARY STENT, HAVING CARDIAC CATHETRIZATION, DISCHARGE INSTRUCTIONS FOR CARDIAC CATHETERIZATION, DISCHARGE INSTRUCTIONS FOR CORONARY ANGIOPLASTY AND STENTING. UNDERSTANDING VERBALIZED.  PT PROVIDED WITH INFORMATION PACKET FOR POST IV CONTRAST MEDIA INSTRUCTIONS. PT IS/IS NOT OVER AGE 61. PT IS NOT DIABETIC. PT DOES HAVE A HISTORY OF HYPERTENSION. PT VERBALIZES UNDERSTANDING TO CONSUME PLENTY OF WATER TO FLUSH IV CONTRAST FROM KIDNEYS.   HOME MEDICATIONS REVIEWED WITH PT. ALL HOME MEDICATIONS AND DOSAGES VERIFIED WITH PT.   POST CATH DISCAHRGE INSTRUCTIONS REVIEWED WITH PT. PT AWARE TO:  ~CHECK COLOR, TEMP, AND CIRCULATION OF RIGHT LEG AT LEAST EVERY 8 HOURS  ~NO DRIVING, OPERATING HEAVY MACHINERY/EQUIPMENT OR POWER TOOLS FOR 24 HOURS  ~NOT TO MAKE IMPORTANT DECISIONS FOR 24 HOURS  ~IF BLEEDING, PERSISTENT NAUSEA/VOMITING UNUSUAL PAIN, SWELLING OR FEVER OCCUR, NOTIFY PHYSICIAN OR CALL 911  ~NO LIFTING ANYTHING OVER 10 POUNDS FOR 5-7 DAYS.  ~MAY SHOWER IN 24 HOURS, DRESSING MAY BE REMOVED IN 24 HOURS  ~CLEAN SITE DAILY WITH MILD SOAP AND WATER TO DECREASE INFECTION RISK, KEEP SITE CLEAN AND DRY  ~NO LAWN MOWER, MOTORCYCLE, CHAINSAW OR ATV FOR 5-7 DAYS  ~DRINK AT LEAST 8-8 OUNCE GLASSES OF WATER A DAY FOR 24 HOURS  F/U APPT MADE WITH DR. Elesa Massed FOR July 18TH AT 10:30 AM. HOWEVER, DR. WARD SAID YOU DON'T HAVE TO GO TO APPOINTMENT, SO CALL LORI TOMORROW (12/14/21) ABOUT SCHEDULING FOLLOW-UP CATHETERIZATION.  STENT CARD AND STENT BOOKLET PROVIDED  ALL INSTRUCTIONS REVIEWED WITH PT AND PT DAUGHTER

## 2021-12-13 NOTE — Nurses Notes (Signed)
PT HAS COMPLETED BEDREST WITH NO S/S BLEEDING, OOZING OR HEMATOMA. PT'S VITALS HAVE REMAINED STABLE. IV FLUIDS HAVE BEEN DISCONTINED. IV HAS BEEN REMOVED WITH CATHETER INTACT. NO S/S IV RELATED COMPLICATIONS. PRESSURE DRESSING PLACED. CATH SITE REMAINS SOFT WITH NO S/S BLEEDING, OOZING OR HEMATOMA NOTED. COLOR, TEMPERATURE, SENSATION REMAINED EQUAL BILATERALLY. CAP REFILL < 2 SECONDS BILATERALLY. PULSES 3+ WITH NO CHANGES FROM INITIAL ASSESSMENT. PT DRESSED WITH MINIMAL TO NO ASSIST. PT TRANSPORTED VIA WHEELCHAIR TO FRONT LOBBY BY CATH LAB STAFF WITH ALL POSSESSIONS INCLUDING CELL PHONE, MEDICATIONS AND CLOTHING. PT DISCHARGED TO HOME CARE OF FAMILY, DRIVEN HOME BY FAMILY VIA PERSONAL CAR. DENIES QUESTIONS OF DISCHARGE INSTRUCTIONS.

## 2021-12-13 NOTE — Nurses Notes (Signed)
Patient IV removed intact at this time. No bleeding noted at site and pressure dressing applied. Patient groin site remains soft to touch, pedal pulses palpable bilterally, 2+. Discharge instructions reinforced with patient at this time and patient verbalized understanding.

## 2021-12-13 NOTE — H&P (Signed)
[facility]      H&P UPDATE FORM                                                                                  Gabriel Best, Gabriel Decamp., 61 y.o. male  Date of Admission:  12/13/2021  Date of Birth:  February 16, 1961    12/13/2021    STOP: IF H&P IS GREATER THAN 30 DAYS FROM SURGICAL DAY COMPLETE NEW H&P IS REQUIRED.     H & P updated the day of the procedure.  1.  H&P completed within 30 days of surgical procedure and has been reviewed within 24 hours of admission but prior to surgery or a procedure requiring anesthesia services, the patient has been examined, and no change has occured in the patients condition since the H&P was completed.       Change in medications: No              Comments:     2.  Patient continues to be appropriate candidate for planned surgical procedure. YES    3. HPI: Gabriel Bestis a very pleasant 62 y.o.malewith a past medical history significant for coronary artery disease. He had four-vessel bypass at age 53 after MI performed at [facility]. In 2013, the patient had a non-ST elevation MI and underwent cardiac catheterization by Dr. Bari Edward at [facility] in [place]. He was found to have an occlusion in the vein graft to the left circumflex, which was borderline stenosis 60% to 70%. There was 75% stenosis in the mid RCA that had a patent vein graft and there was also patent graft to the diagonal. Stress test in 2018 for preoperative assessment was low risk study. EF was 64% on resting imaging. There was no TID. There was a moderate size apical perfusion defect that was largely fixed consistent with previous defect just slightly extended. Repeat stress test in May 2022 was a normal, low risk study, normal myocardial perfusion imaging. EF was slightly reduced to 46%. Echocardiogram in March 2022 showed EF 45% with moderate aortic insufficiency. Echo 08/17/21 showed EF 45% with moderate AI. Patient had worsening symptoms recently with midsternal  chest pain with radiate to both sides of chest, similar to reflux symptoms.  He was seen in ER with unremarkable cardiac workup, started on reflux medications, but symptoms persisted.  His last cath was in 2013.  We recommended left heart catheterization for further evaluation of his symptoms.  On 11/23/2021 [facility] cardiac catheterization findings of  complex multivessel CAD, successful limited PCI to native RCA DES x2, residual CAD and the RPLV, distal RCA, SVG diet, LM/LCX.  Patient returns for staged PCI.      Cavion Faiola, FNP-C

## 2021-12-13 NOTE — Nurses Notes (Signed)
Patient up to walk at this time. Patient voided without difficulty. Discussed discharge instructions with patient. Provided written AVS. All questions answered. Instructed patient to call Lawson Fiscal at Dr. Barney Drain office to schedule another PCI. Patient verbalized understanding.   Discussed discharge instructions with patient's daughter as well.

## 2021-12-13 NOTE — Nurses Notes (Signed)
Patient HOB elevated 30 degrees at this time. Patient eating and drinking without difficulty. Site soft to touch with no s/s of hematoma or bleeding.

## 2021-12-13 NOTE — Nurses Notes (Signed)
Patient back to cath lab holding at this time. Patient alert and oriented x3. Patient denies pain. Patient has femoral dressing in place, no s/s of bleeding or hematoma noted. Pedal pulses palpable bilaterally, 2+.   Patient connected to continuous SPO2, BP, and cardiac monitoring.   Patient called his daughter and his sister at this time.

## 2021-12-15 ENCOUNTER — Other Ambulatory Visit (INDEPENDENT_AMBULATORY_CARE_PROVIDER_SITE_OTHER): Payer: Self-pay | Admitting: Family Medicine

## 2021-12-21 DIAGNOSIS — I2510 Atherosclerotic heart disease of native coronary artery without angina pectoris: Secondary | ICD-10-CM

## 2022-01-03 ENCOUNTER — Encounter (INDEPENDENT_AMBULATORY_CARE_PROVIDER_SITE_OTHER): Payer: BC Managed Care – PPO | Admitting: INTERVENTIONAL CARDIOLOGY

## 2022-01-04 DIAGNOSIS — I2510 Atherosclerotic heart disease of native coronary artery without angina pectoris: Secondary | ICD-10-CM

## 2022-01-09 ENCOUNTER — Telehealth (HOSPITAL_COMMUNITY): Payer: Self-pay | Admitting: INTERVENTIONAL CARDIOLOGY

## 2022-01-10 ENCOUNTER — Other Ambulatory Visit: Payer: Self-pay

## 2022-01-10 ENCOUNTER — Encounter (HOSPITAL_COMMUNITY)
Admission: RE | Disposition: A | Discharge status: Home / Self Care | Payer: Self-pay | Source: Ambulatory Visit | Attending: INTERVENTIONAL CARDIOLOGY

## 2022-01-10 ENCOUNTER — Encounter (HOSPITAL_COMMUNITY): Payer: Self-pay | Admitting: INTERVENTIONAL CARDIOLOGY

## 2022-01-10 ENCOUNTER — Inpatient Hospital Stay
Admission: RE | Admit: 2022-01-10 | Discharge: 2022-01-10 | Disposition: A | Discharge status: Home / Self Care | Payer: BC Managed Care – PPO | Source: Ambulatory Visit | Attending: INTERVENTIONAL CARDIOLOGY | Admitting: INTERVENTIONAL CARDIOLOGY

## 2022-01-10 DIAGNOSIS — I2584 Coronary atherosclerosis due to calcified coronary lesion: Secondary | ICD-10-CM

## 2022-01-10 DIAGNOSIS — I25810 Atherosclerosis of coronary artery bypass graft(s) without angina pectoris: Secondary | ICD-10-CM

## 2022-01-10 DIAGNOSIS — I351 Nonrheumatic aortic (valve) insufficiency: Secondary | ICD-10-CM | POA: Insufficient documentation

## 2022-01-10 DIAGNOSIS — I2510 Atherosclerotic heart disease of native coronary artery without angina pectoris: Secondary | ICD-10-CM

## 2022-01-10 DIAGNOSIS — Z7902 Long term (current) use of antithrombotics/antiplatelets: Secondary | ICD-10-CM

## 2022-01-10 DIAGNOSIS — I25709 Atherosclerosis of coronary artery bypass graft(s), unspecified, with unspecified angina pectoris: Secondary | ICD-10-CM | POA: Insufficient documentation

## 2022-01-10 DIAGNOSIS — I25119 Atherosclerotic heart disease of native coronary artery with unspecified angina pectoris: Secondary | ICD-10-CM | POA: Insufficient documentation

## 2022-01-10 DIAGNOSIS — I10 Essential (primary) hypertension: Secondary | ICD-10-CM | POA: Insufficient documentation

## 2022-01-10 DIAGNOSIS — Z951 Presence of aortocoronary bypass graft: Secondary | ICD-10-CM | POA: Insufficient documentation

## 2022-01-10 DIAGNOSIS — F17210 Nicotine dependence, cigarettes, uncomplicated: Secondary | ICD-10-CM | POA: Insufficient documentation

## 2022-01-10 DIAGNOSIS — E785 Hyperlipidemia, unspecified: Secondary | ICD-10-CM | POA: Insufficient documentation

## 2022-01-10 DIAGNOSIS — I252 Old myocardial infarction: Secondary | ICD-10-CM | POA: Insufficient documentation

## 2022-01-10 DIAGNOSIS — Z955 Presence of coronary angioplasty implant and graft: Secondary | ICD-10-CM | POA: Insufficient documentation

## 2022-01-10 LAB — PT/INR
INR: 1.03 (ref <=5.00)
PROTHROMBIN TIME: 11.9 seconds (ref 9.8–12.7)

## 2022-01-10 LAB — BASIC METABOLIC PANEL
ANION GAP: 6 mmol/L — ABNORMAL LOW (ref 10–20)
BUN/CREA RATIO: 10 (ref 6–22)
BUN: 13 mg/dL (ref 7–25)
CALCIUM: 8.5 mg/dL — ABNORMAL LOW (ref 8.6–10.3)
CHLORIDE: 106 mmol/L (ref 98–107)
CO2 TOTAL: 21 mmol/L (ref 21–31)
CREATININE: 1.24 mg/dL (ref 0.60–1.30)
ESTIMATED GFR: 67 mL/min/{1.73_m2} (ref >59)
GLUCOSE: 115 mg/dL — ABNORMAL HIGH (ref 74–109)
OSMOLALITY, CALCULATED: 268 mOsm/kg — ABNORMAL LOW (ref 270–290)
POTASSIUM: 5 mmol/L (ref 3.5–5.1)
SODIUM: 133 mmol/L — ABNORMAL LOW (ref 136–145)

## 2022-01-10 LAB — CBC WITH DIFF
BASOPHIL #: 0.1 10*3/uL (ref 0.00–0.30)
BASOPHIL %: 1 % (ref 0–3)
EOSINOPHIL #: 0.2 10*3/uL (ref 0.00–0.80)
EOSINOPHIL %: 2 % (ref 0–7)
HCT: 44.5 % (ref 42.0–51.0)
HGB: 14.9 g/dL (ref 13.5–18.0)
LYMPHOCYTE #: 2.6 10*3/uL (ref 1.10–5.00)
LYMPHOCYTE %: 26 % (ref 25–45)
MCH: 30.6 pg (ref 27.0–32.0)
MCHC: 33.5 g/dL (ref 32.0–36.0)
MCV: 91.4 fL (ref 78.0–99.0)
MONOCYTE #: 0.9 10*3/uL (ref 0.00–1.30)
MONOCYTE %: 9 % (ref 0–12)
MPV: 8.6 fL (ref 7.4–10.4)
NEUTROPHIL #: 6.4 10*3/uL (ref 1.80–8.40)
NEUTROPHIL %: 63 % (ref 40–76)
PLATELETS: 148 10*3/uL (ref 140–440)
RBC: 4.87 10*6/uL (ref 4.20–6.00)
RDW: 13.4 % (ref 11.6–14.8)
WBC: 10.2 10*3/uL (ref 4.0–10.5)
WBCS UNCORRECTED: 10.2 10*3/uL

## 2022-01-10 LAB — PTT (PARTIAL THROMBOPLASTIN TIME): APTT: 29.9 seconds (ref 26.0–36.0)

## 2022-01-10 LAB — MAGNESIUM: MAGNESIUM: 2 mg/dL (ref 1.9–2.7)

## 2022-01-10 SURGERY — CORONARY ARTERY ANGIOPLASTY - INITIAL VESSEL
Anesthesia: IV Sedation (Nurse Monitored)

## 2022-01-10 MED ORDER — LIDOCAINE HCL 10 MG/ML (1 %) INJECTION SOLUTION
Freq: Once | INTRAMUSCULAR | Status: DC | PRN
Start: 2022-01-10 — End: 2022-01-10
  Administered 2022-01-10: 10 mL via INTRADERMAL

## 2022-01-10 MED ORDER — MIDAZOLAM 5 MG/ML INJECTION WRAPPER
INTRAMUSCULAR | Status: AC
Start: 2022-01-10 — End: 2022-01-10
  Filled 2022-01-10: qty 1

## 2022-01-10 MED ORDER — FENTANYL (PF) 50 MCG/ML INJECTION SOLUTION
INTRAMUSCULAR | Status: AC
Start: 2022-01-10 — End: 2022-01-10
  Filled 2022-01-10: qty 2

## 2022-01-10 MED ORDER — ASPIRIN 81 MG CHEWABLE TABLET
324.0000 mg | CHEWABLE_TABLET | Freq: Once | ORAL | Status: AC
Start: 2022-01-10 — End: 2022-01-10
  Administered 2022-01-10: 324 mg via ORAL

## 2022-01-10 MED ORDER — SODIUM CHLORIDE 0.9 % (FLUSH) INJECTION SYRINGE
3.0000 mL | INJECTION | Freq: Three times a day (TID) | INTRAMUSCULAR | Status: DC
Start: 2022-01-10 — End: 2022-01-10

## 2022-01-10 MED ORDER — SODIUM CHLORIDE 0.9 % INTRAVENOUS SOLUTION
INTRAVENOUS | Status: AC | PRN
Start: 2022-01-10 — End: 2022-01-10
  Administered 2022-01-10: 75 mL/h via INTRAVENOUS

## 2022-01-10 MED ORDER — NICARDIPINE 25 MG/10 ML INTRAVENOUS SOLUTION
INTRAVENOUS | Status: AC
Start: 2022-01-10 — End: 2022-01-10
  Filled 2022-01-10: qty 10

## 2022-01-10 MED ORDER — NICARDIPINE 25MG IN NS 250 ML (CORONARY BOLUS)
Freq: Once | Status: DC | PRN
Start: 2022-01-10 — End: 2022-01-10
  Administered 2022-01-10 (×2): 200 ug via INTRACORONARY
  Administered 2022-01-10: 100 ug via INTRACORONARY
  Administered 2022-01-10 (×2): 200 ug via INTRACORONARY
  Administered 2022-01-10: 100 ug via INTRACORONARY
  Administered 2022-01-10: 200 ug via INTRACORONARY

## 2022-01-10 MED ORDER — DIAZEPAM 5 MG TABLET
5.0000 mg | ORAL_TABLET | ORAL | Status: AC
Start: 2022-01-10 — End: 2022-01-10
  Administered 2022-01-10: 5 mg via ORAL

## 2022-01-10 MED ORDER — DIPHENHYDRAMINE 25 MG CAPSULE
ORAL_CAPSULE | ORAL | Status: AC
Start: 2022-01-10 — End: 2022-01-10
  Filled 2022-01-10: qty 1

## 2022-01-10 MED ORDER — SODIUM CHLORIDE 0.9 % INTRAVENOUS SOLUTION
INTRAVENOUS | Status: DC
Start: 2022-01-10 — End: 2022-01-10

## 2022-01-10 MED ORDER — CLOPIDOGREL 300 MG TABLET
ORAL_TABLET | ORAL | Status: AC
Start: 2022-01-10 — End: 2022-01-10
  Filled 2022-01-10: qty 1

## 2022-01-10 MED ORDER — BIVALIRUDIN 5 MG/ML BOLUS FROM INFUSION
Freq: Once | INTRAVENOUS | Status: DC | PRN
Start: 2022-01-10 — End: 2022-01-10
  Administered 2022-01-10: 67.5 mg via INTRAVENOUS

## 2022-01-10 MED ORDER — NITROGLYCERIN 100 MCG/ML IN NS INJECTION
INJECTION | Freq: Once | INTRAVENOUS | Status: DC | PRN
Start: 2022-01-10 — End: 2022-01-10
  Administered 2022-01-10: 200 ug via INTRACORONARY

## 2022-01-10 MED ORDER — MIDAZOLAM 5 MG/ML INJECTION WRAPPER
Freq: Once | INTRAMUSCULAR | Status: DC | PRN
Start: 2022-01-10 — End: 2022-01-10
  Administered 2022-01-10: 2 mg via INTRAVENOUS
  Administered 2022-01-10 (×4): 1 mg via INTRAVENOUS

## 2022-01-10 MED ORDER — CLOPIDOGREL 300 MG TABLET
ORAL_TABLET | Freq: Once | ORAL | Status: DC | PRN
Start: 2022-01-10 — End: 2022-01-10
  Administered 2022-01-10: 300 mg via ORAL

## 2022-01-10 MED ORDER — SODIUM CHLORIDE 0.9 % INTRAVENOUS SOLUTION
INTRAVENOUS | Status: AC | PRN
Start: 2022-01-10 — End: 2022-01-10
  Administered 2022-01-10 (×2): 1.75 mg/kg/h via INTRAVENOUS

## 2022-01-10 MED ORDER — BIVALIRUDIN 250 MG INTRAVENOUS POWDER FOR SOLUTION
INTRAVENOUS | Status: AC
Start: 2022-01-10 — End: 2022-01-10
  Filled 2022-01-10: qty 5

## 2022-01-10 MED ORDER — ASPIRIN 81 MG CHEWABLE TABLET
CHEWABLE_TABLET | ORAL | Status: AC
Start: 2022-01-10 — End: 2022-01-10
  Filled 2022-01-10: qty 4

## 2022-01-10 MED ORDER — IOHEXOL 350 MG IODINE/ML INTRAVENOUS SOLUTION
Freq: Once | INTRAVENOUS | Status: DC | PRN
Start: 2022-01-10 — End: 2022-01-10
  Administered 2022-01-10: 230 mL via INTRAVENOUS

## 2022-01-10 MED ORDER — HEPARIN (PORCINE) (PF) 2,000 UNIT/1,000 ML IN 0.9 % SODIUM CHLORIDE IV
INTRAVENOUS | Status: AC
Start: 2022-01-10 — End: 2022-01-10
  Filled 2022-01-10: qty 2000

## 2022-01-10 MED ORDER — LIDOCAINE HCL 10 MG/ML (1 %) INJECTION SOLUTION
INTRAMUSCULAR | Status: AC
Start: 2022-01-10 — End: 2022-01-10
  Filled 2022-01-10: qty 50

## 2022-01-10 MED ORDER — DIAZEPAM 5 MG TABLET
ORAL_TABLET | ORAL | Status: AC
Start: 2022-01-10 — End: 2022-01-10
  Filled 2022-01-10: qty 1

## 2022-01-10 MED ORDER — DIPHENHYDRAMINE 25 MG CAPSULE
25.0000 mg | ORAL_CAPSULE | ORAL | Status: AC
Start: 2022-01-10 — End: 2022-01-10
  Administered 2022-01-10: 25 mg via ORAL

## 2022-01-10 MED ORDER — VERAPAMIL 2.5 MG/ML INTRAVENOUS SOLUTION
INTRAVENOUS | Status: AC
Start: 2022-01-10 — End: 2022-01-10
  Filled 2022-01-10: qty 2

## 2022-01-10 MED ORDER — SODIUM CHLORIDE 0.9 % (FLUSH) INJECTION SYRINGE
3.0000 mL | INJECTION | INTRAMUSCULAR | Status: DC | PRN
Start: 2022-01-10 — End: 2022-01-10

## 2022-01-10 MED ORDER — FENTANYL (PF) 50 MCG/ML INJECTION WRAPPER
INJECTION | Freq: Once | INTRAMUSCULAR | Status: DC | PRN
Start: 2022-01-10 — End: 2022-01-10
  Administered 2022-01-10 (×2): 25 ug via INTRAVENOUS
  Administered 2022-01-10: 50 ug via INTRAVENOUS
  Administered 2022-01-10 (×2): 25 ug via INTRAVENOUS

## 2022-01-10 SURGICAL SUPPLY — 48 items
CAN SUCT PENUMBRA ENG INDIGO (MED SURG SUPPLIES) ×2 IMPLANT
CATH GD 6FR 100CM AL1 CURVE LNCHR LRG LUM RADOPQ FLXB DIST SEG COR NYL STRL LF (CATHETERS) ×1 IMPLANT
CATH GD 6FR 100CM AL1 CURVE LN_CHR LRG LUM RADOPQ FLXB DIST (CATHETERS) ×2
CATH GD 6FR 100CM EBU3.75 CURVE SHR NX BAL SFT TIP DIST (CATHETERS) ×2
CATH GD 6FR 100CM EBU3.75 CURVE SHR NX BAL SFT TIP DIST SLEEVE FLXB DIST SEG COR HDPE STRL (CATHETERS) ×1 IMPLANT
CATH GD 6FR 100CM STD JR4 CURVE SHR NX BAL SFT TIP DIST (CATHETERS) ×2
CATH GD 6FR 100CM STD JR4 CURVE SHR NX BAL SFT TIP DIST SLEEVE FLXB DIST SEG COR HDPE STRL (CATHETERS) ×1 IMPLANT
CATH NC EUPHORA 3.5MM 15MM 142CM RX LOW PROF RADOPQ BAL DIL DURA-TRC NYL LF  PTCA (BALLOON) ×1 IMPLANT
CATH NC EUPHORA 3.5MM 15MM 142_CM RX LO PRFL RADOPQ BAL DIL (BALLOON) ×2
CATH NC EUPHORA 3MM 20MM 142CM_RX LO PRFL RADOPQ BAL DIL (BALLOON) ×2
CATH NC EUPHORA 4.5MM 8MM 142CM RX NC RADOPQ BAL DIL DURA-TRC NYL (BALLOON) ×1 IMPLANT
CATH NC EUPHORA 4.5MM 8MM 142C_M RX NC RADOPQ BAL DIL (BALLOON) ×2
CATH NC EUPHORA 4MM 12MM 142CM RX LOW PROF RADOPQ BAL DIL DURA-TRC NYL LF  PTCA (BALLOON) ×1 IMPLANT
CATH NC EUPHORA 4MM 12MM 142CM_RX LO PRFL RADOPQ BAL DIL (BALLOON) ×2
CATH NC EUPHORA 4MM 15MM 142CM RX LOW PROF RADOPQ BAL DIL DURA-TRC NYL LF  PTCA (BALLOON) ×1 IMPLANT
CATH NC EUPHORA 4MM 15MM 142CM_RX LO PRFL RADOPQ BAL DIL (BALLOON) ×2
CATH NC EUPHORA POWERTRAC 2.7-2.1FR 3MM .056IN 20MM 142CM RX LOW GRW PROF RADOPQ NC BAL DIL DURA-TRC (BALLOON) ×1 IMPLANT
CATH SPRINTR LGND 1.25MM 6MM 142CM 21CM .02IN CX PROF BAL (BALLOON) ×2
CATH SPRINTR LGND 1.25MM 6MM 142CM 21CM .02IN CX PROF BAL DIL DURA-TRC ACPT .056IN GUIDE CATH PTCA (BALLOON) ×1 IMPLANT
CATH SPRINTR LGND MCROBRT 2.5MM 20MM 142CM 21CM .022IN RX CX (BALLOON) ×4
CATH SPRINTR LGND MCROBRT 2.5MM 20MM 142CM 21CM .022IN RX CX PROF BAL DIL FULCRUM DURA-TRC LF  ACPT (BALLOON) ×2 IMPLANT
CATH SPRINTR LGND MCROBRT 3MM 15MM 142CM 21CM .023IN RX CX PROF BAL DIL FULCRUM DURA-TRC ACPT .056IN (BALLOON) ×1 IMPLANT
CATH SPRINTR LGND MCROBRT 3MM_15MM 142CM 21CM .023IN RX CX (BALLOON) ×2
CATH THROMBECT INDIGO SYS .044IN 140CM ASP KIT COR STRL LF (THROMBECTOMY) ×1 IMPLANT
CATH THROMBECT INDIGO SYS .044_IN 140CM ASP KIT COR STRL LF (THROMBECTOMY) ×2
CATH US EE PLAT 3.3-2.9FR 20MM 150CM 24CM 20MHZ RX LUM RADOPQ TAPER TIP GLYDX ACPT .014IN GW TRKBK (CATHETERS) ×1 IMPLANT
CATH US EE PLAT 3.3-2.9FR 20MM_150CM 24CM 20MHZ RX LUM (CATHETERS) ×2
COR STENT ONYX FRNTR 3.5MM 30MM ZOTAROLIMUS SYSTEM LRG VESSEL STRL (STENTS CORONARY) ×1 IMPLANT
DEVICE CLSR CORDIS MYNXGRIP 6-7FR BAL CATH INTGR SEAL LOCK SYRG ATRAUMA TIP GRN 10ML VAS STRL LF (IMPLANTS CARDIOTHORACIC) ×1 IMPLANT
GW .035IN 260CM ANGIO PTFE FIX COR STD BODY STN FNSH 2 END 3MM J CURVE STRL (WIRE) ×1 IMPLANT
GW HITRQ ALL STAR .014IN 3CM 190CM VAS STR (WIRE) ×2 IMPLANT
GW INQWR .035IN 150CM FIX COR FINGER STRG PTFE VAS 3MM RAD STD J STRL LF (WIRE) ×1 IMPLANT
GW INQWR .035IN 150CM FIX COR_NGR STRG STD SHFT PTFE VAS 3 (WIRE) ×2
GW INQWR .035IN 260CM FIX COR_FINGER STRG STD SHAFT PTFE VAS (WIRE) ×2
GW RUNTHROUGH .36MM 3CM 180CM RADOPQ X FLPY VAS STRL LF  DISP PTCA (WIRE) ×1 IMPLANT
GW RUNTHROUGH NS .014IN 3CM 18_CM ATRM X FLPY TIP RADOPQ (WIRE) ×2
GW RUNTHROUGH NS IZANAI JNT-DUO COR .014IN 3CM 180CM SFT STR (WIRE) ×2
GW RUNTHROUGH NS IZANAI JNT-DUO COR .014IN 3CM 180CM SFT STR TIP RADOPC M-COAT NITINOL HDRPH VAS COR (WIRE) ×1 IMPLANT
KIT INTROD 10CM 4FR 21GA MRT MK .018IN NITINOL GW PLAT TIP (VASCULAR) ×2
KIT INTROD 10CM 4FR 21GA MRT MK .018IN NITINOL GW PLAT TIP ECHO ENH NEEDLE MINI ACCESS 7CM (VASCULAR) ×1 IMPLANT
SET ADMN MERIT MEDICAL SYS INJ MED CNRST S DISP (VASCULAR) ×2 IMPLANT
SHEATH INTROD 10CM 6FR PINN COR SS HDRPH PTFE SNAPON DIL LOCK KINK RST SMOOTH TRNS 2.5CM ACPT .038IN (INTRODUCER) ×1 IMPLANT
SHEATH SUPER 6FR 11CM .035_RSS602 10EA/BX (INTRODUCER) ×2
STENT ONYX FRNTR 2.5MM 38MM COR RX STRL LF (STENTS CORONARY) ×1 IMPLANT
STENT ONYX FRNTR 4MM 12MM COR RX STRL LF (STENTS CORONARY) ×1 IMPLANT
STENT ONYX FRNTR 4MM 18MM COR RX STRL LF (STENTS CORONARY) ×1 IMPLANT
VALVE HMSTS CLR 7.3FR PHD METAL PLYCRB SIL INSERTION TOOL (CARDIAC) ×2
VALVE HMSTS CLR 7.3FR PHD METAL PLYCRB SIL INSERTION TOOL TORQUE DEV COMPRESS SEAL PUSH RELEASE MECH (CARDIAC) ×1 IMPLANT

## 2022-01-10 NOTE — H&P (Addendum)
[facility]    Name: Gabriel Best.  Age: 61 y.o.  Date of Service: 01/10/2022    Primary Care Provider: Mickey Farber, DO  Chief Complaint:   Chief Complaint   Patient presents with    Cardiac Cath       HPI:  Gabriel Best. is a 61 y.o. male with a past medical history significant for coronary artery disease.  He had four-vessel bypass at age 29 after MI performed at [facility].  LV function was preserved.  In 2013, the patient had a non-ST elevation MI and underwent cardiac catheterization by Dr. Bari Edward at [facility] in [place].  He was found to have an occlusion in the vein graft to the left circumflex, which was borderline stenosis 60% to 70%.  There was 75% stenosis in the mid RCA that had a patent vein graft and there was also patent graft to the diagonal.  Stress test in 2018 for preoperative assessment was low risk study.  EF was 64% on resting imaging.  There was no TID.  There was a moderate size apical perfusion defect that was largely fixed consistent with previous defect just slightly extended.  Repeat stress test in May 2022 was a normal, low risk study, normal myocardial perfusion imaging.  EF was slightly reduced to 46%. Echocardiogram in March 2022 showed EF 45% with moderate aortic insufficiency.    The patient recently underwent repeat cardiac catheterization due to significantly increased anginal symptoms.  Over the last month he underwent staged PCI to the right coronary artery which was subtotally occluded and heavily calcified in 2 different settings.  He ultimately had a good result with this vessel.  He presents today for staged PCI to the saphenous vein graft to the diagonal branch as well as to the left main/left circumflex/OM2.  The patient continues to describe dyspnea on exertion occasional chest pain but he did derive improvement from the PCI to the RCA.    Past Medical History:  Past Medical History:   Diagnosis Date    Atherosclerotic heart  disease of native coronary artery without angina pectoris     Essential hypertension     GERD (gastroesophageal reflux disease)     Hyperlipidemia     Renal insufficiency     Tobacco abuse      Social History:  Social History     Tobacco Use   Smoking Status Every Day    Packs/day: 1.00    Types: Cigarettes   Smokeless Tobacco Never      Social History     Substance and Sexual Activity   Alcohol Use Never      Social History     Substance and Sexual Activity   Drug Use Never      Current Medications:  No current outpatient medications on file.     Allergies:  No Known Allergies   Review of Systems:  Complete ROS was performed and otherwise negative unless noted in HPI.      Vital Signs:  Vitals:    01/10/22 1515 01/10/22 1530 01/10/22 1545 01/10/22 1600   BP: (!) 142/80 137/66 125/66 122/67   Pulse: 69 54 57 60   Resp: 16 (!) 22 (!) 22 (!) 22   Temp:       SpO2: 99% 98% 96% 95%   Weight:       Height:       BMI:          Physical  Exam:  General: Pt resting comfortably in no acute distress and appears stated age.    Neck: No JVD, no carotid bruit. Neck supple, symmetrical, trachea midline.   Lungs:  Normal respiratory effort, lungs clear to auscultation bilaterally.    Cardiovascular:  Regular rate and rhythm.  Normal S1 and S2 without murmur, gallop, or rub.  Abdomen: Soft, non-tender and bowel sounds normal.    Extremities: Extremities normal, atraumatic, no cyanosis or edema.    Neurologic: Alert and oriented x3.       Assessment:  Severe native and graft CAD  Atypical angina    Plan:   Will proceed with staged PCI as planned.

## 2022-01-10 NOTE — Nurses Notes (Signed)
PATIENT AMBULATING WITHOUT DIFFICULTY, DISCHARGE INSTRUCTIONS REVIEWED WITH PATIENT. FOLLOW UP APPT WITH DR WARD s OFFICE ON AUGUST 29 TH AT 10:40 AM

## 2022-01-10 NOTE — Nurses Notes (Signed)
DR WARD REVIEWED EKGs, NO NEW MEDICATION CHANGES.  PATIENT HAS A FOLLOW UP APPT ON AUGUST 29TH AT 10:25 AM

## 2022-01-10 NOTE — Nurses Notes (Signed)
DR WARD REVIEWED  PATIENT'S EKGS.  NO MEDICATION CHANGES AT THIS TIME.  PATIENT OK TO DISCHARGE AT 1800.

## 2022-01-19 ENCOUNTER — Encounter (INDEPENDENT_AMBULATORY_CARE_PROVIDER_SITE_OTHER): Payer: Self-pay

## 2022-01-19 ENCOUNTER — Ambulatory Visit (INDEPENDENT_AMBULATORY_CARE_PROVIDER_SITE_OTHER): Payer: MEDICAID | Admitting: Family

## 2022-01-19 DIAGNOSIS — R079 Chest pain, unspecified: Secondary | ICD-10-CM | POA: Insufficient documentation

## 2022-01-23 ENCOUNTER — Ambulatory Visit (INDEPENDENT_AMBULATORY_CARE_PROVIDER_SITE_OTHER): Payer: Self-pay | Admitting: Family

## 2022-01-24 ENCOUNTER — Encounter (INDEPENDENT_AMBULATORY_CARE_PROVIDER_SITE_OTHER): Payer: Self-pay | Admitting: Family

## 2022-01-24 ENCOUNTER — Other Ambulatory Visit: Payer: Self-pay

## 2022-01-24 ENCOUNTER — Ambulatory Visit (INDEPENDENT_AMBULATORY_CARE_PROVIDER_SITE_OTHER): Payer: MEDICAID | Admitting: Family

## 2022-01-24 VITALS — BP 121/73 | HR 68 | Temp 98.8°F | Ht 72.0 in | Wt 197.0 lb

## 2022-01-24 DIAGNOSIS — K219 Gastro-esophageal reflux disease without esophagitis: Secondary | ICD-10-CM

## 2022-01-24 DIAGNOSIS — M4802 Spinal stenosis, cervical region: Secondary | ICD-10-CM | POA: Insufficient documentation

## 2022-01-24 DIAGNOSIS — I25119 Atherosclerotic heart disease of native coronary artery with unspecified angina pectoris: Secondary | ICD-10-CM

## 2022-01-24 DIAGNOSIS — Z951 Presence of aortocoronary bypass graft: Secondary | ICD-10-CM

## 2022-01-24 DIAGNOSIS — I10 Essential (primary) hypertension: Secondary | ICD-10-CM

## 2022-01-24 DIAGNOSIS — F411 Generalized anxiety disorder: Secondary | ICD-10-CM

## 2022-01-24 DIAGNOSIS — Z125 Encounter for screening for malignant neoplasm of prostate: Secondary | ICD-10-CM

## 2022-01-24 DIAGNOSIS — F17210 Nicotine dependence, cigarettes, uncomplicated: Secondary | ICD-10-CM

## 2022-01-24 DIAGNOSIS — R739 Hyperglycemia, unspecified: Secondary | ICD-10-CM

## 2022-01-24 DIAGNOSIS — E538 Deficiency of other specified B group vitamins: Secondary | ICD-10-CM

## 2022-01-24 DIAGNOSIS — Z720 Tobacco use: Secondary | ICD-10-CM | POA: Insufficient documentation

## 2022-01-24 MED ORDER — CLOPIDOGREL 75 MG TABLET
75.0000 mg | ORAL_TABLET | Freq: Every day | ORAL | Status: DC
Start: 2022-01-24 — End: 2022-02-21

## 2022-01-24 MED ORDER — ATORVASTATIN 40 MG TABLET
40.0000 mg | ORAL_TABLET | Freq: Every day | ORAL | Status: DC
Start: 2022-01-24 — End: 2022-02-21

## 2022-01-24 NOTE — Patient Instructions (Signed)
Complete fasting labs.   Continue current medications.

## 2022-01-24 NOTE — Progress Notes (Signed)
FAMILY MEDICINE, MEDICAL OFFICE BUILDING  [address]  [place] [address]          Name: Gabriel Best. MRN:  [id number]   Date: 01/24/2022 Age: 61 y.o.          Provider: Elliot Gurney, FNP-BC    Reason for visit: New Patient      History of Present Illness:  Gabriel Best. is a 61 y.o. male presenting to establish care.   Dr Tresea Mall was previous PCP and he retired. He acutely saw Dr Phill Mutter office for refill of alprazolam.     Extensive cardiac history, following with cardiologist.   Has f/u Feb 14, 2022. No acute symptoms. Most recent cardiac cath July 2023. Chronic DOE.   Has had 3 PCI with total of 9 stents.   H/o MI and CABG.   Meds:   NTG PRN- has not had to use since last stent  Aspirin 81 mg daily  Atorvastatin 40 mg daily   Clopidogrel 75 mg daily  Lisinopril 5mg  1/2 tab daily   Consults: Dr    Anxiety  Onset: age 80s  Aug 2023: long-standing use of alprazolam d/t h/o anxiety attacks. He ran out recently during change in PCP and had exacerbation of symptoms. I reviewed risks of withdrawals. BOP consistent, no indication of abuse. Denies SI. No SE of meds. No respiratory depression. He waits at least 1 hour between alprazolam and tramadol when he uses it.   Meds:   Alprazolam 1 mg (1/2 to 1 tab) BID  Citalopram 20 mg daily  Consults: none     GERD  Aug 2023: no heartburn with medication. Was having significant symptoms prior to medication. No black/blood in stool.   Mag 2.0 Feb 2023  Investigations:   No EGD that he is aware of  Colonoscopy Winter 2022  Meds:   Famotidine 20 mg daily  Omeprazole 20 mg daily    Vitamin B12 Def  Med:  Monthly injections (self administers)   Folate     Chronic back pain/DDD/bil knee pain  Hx: cervical fusion, bil knee surgery   Med: tramadol PRN only, usually only takes 1 tab a couple of times a week     Tobacco Use  Aug 2023: he is motivated to try to quit. Candy helps. He had gotten down to 4-5 cig daily until he ran out of alprazolam and resumed  1 ppd. Verbalized intent to decrease.   Onset: age 29 Quit x 11 years at age 50 after MI. Restarted age 72 during divorce. Avg 1 PPD.     Historical Data    Past Medical History:  Past Medical History:   Diagnosis Date    Anxiety and depression     Atherosclerotic heart disease of native coronary artery without angina pectoris     Chronic back pain     Chronic prostatitis     Urologist Dr 59 in the past    Coronary disease     Essential hypertension     GERD (gastroesophageal reflux disease)     Hyperlipidemia     Renal insufficiency     Tobacco abuse     Vitamin B12 deficiency          Past Surgical History:  Past Surgical History:   Procedure Laterality Date    CARDIAC CATHETERIZATION  06/19/2012    HX CORONARY ARTERY BYPASS GRAFT      HX KNEE SURGERY Bilateral  Dr Murriel Hopper LAP CHOLECYSTECTOMY      NECK SURGERY      Vertebraes fused back together         Allergies:  No Known Allergies  Medications:  Current Outpatient Medications   Medication Sig    ALPRAZolam (XANAX) 1 mg Oral Tablet Take 1 Tablet (1 mg total) by mouth Twice per day as needed 1/2 to 1 tab prn    aspirin (ECOTRIN) 81 mg Oral Tablet, Delayed Release (E.C.) Take 1 Tablet (81 mg total) by mouth Once a day    atorvastatin (LIPITOR) 40 mg Oral Tablet Take 1 Tablet (40 mg total) by mouth Once a day    citalopram (CELEXA) 20 mg Oral Tablet Take 1 Tablet (20 mg total) by mouth Once a day    clopidogreL (PLAVIX) 75 mg Oral Tablet Take 1 Tablet (75 mg total) by mouth Once a day    cyanocobalamin (VITAMIN B12) 1,000 mcg/mL Injection Solution Inject 1 mL (1,000 mcg total) into the muscle Every 30 days    famotidine (PEPCID) 20 mg Oral Tablet Take 1 Tablet (20 mg total) by mouth Once a day    folic acid 0.8 mg Oral Capsule Take 1 Capsule (0.8 mg total) by mouth Once a day    lisinopriL (PRINIVIL) 5 mg Oral Tablet Take 0.5 Tablets (2.5 mg total) by mouth Once a day    nitroGLYCERIN (NITROSTAT) 0.4 mg Sublingual Tablet, Sublingual Place 1 Tablet  (0.4 mg total) under the tongue Every 5 minutes as needed for Chest pain for 3 doses over 15 minutes    omeprazole (PRILOSEC) 20 mg Oral Capsule, Delayed Release(E.C.) Take 1 Capsule (20 mg total) by mouth Once a day    traMADoL (ULTRAM) 50 mg Oral Tablet Take 1 Tablet (50 mg total) by mouth Twice per day as needed for Pain     Family History:  Family Medical History:       Problem Relation (Age of Onset)    Cancer Mother    Coronary Artery Disease Father            Social History:  Social History     Socioeconomic History    Marital status: Divorced   Occupational History    Occupation: disabled d/t back   Tobacco Use    Smoking status: Every Day     Packs/day: 1.00     Types: Cigarettes    Smokeless tobacco: Never   Vaping Use    Vaping Use: Never used   Substance and Sexual Activity    Alcohol use: Yes     Comment: rare social use only    Drug use: Never           Review of Systems:  Review of Systems   Constitutional:  Negative for fever, malaise/fatigue and weight loss.   Respiratory:  Negative for shortness of breath.    Cardiovascular:  Negative for chest pain.   Gastrointestinal:  Negative for blood in stool, heartburn and melena.   Psychiatric/Behavioral:  Negative for depression and suicidal ideas. The patient is nervous/anxious.       Physical Exam:  Vital Signs:  Vitals:    01/24/22 1517   BP: 121/73   Pulse: 68   Temp: 37.1 C (98.8 F)   TempSrc: Temporal   SpO2: 95%   Weight: 89.4 kg (197 lb)   Height: 1.829 m (6')   BMI: 26.77     Physical Exam  Vitals and nursing note reviewed.  Constitutional:       General: He is not in acute distress.     Appearance: Normal appearance. He is normal weight. He is not ill-appearing.   Eyes:      General: No scleral icterus.  Neck:      Vascular: No carotid bruit.   Cardiovascular:      Rate and Rhythm: Normal rate and regular rhythm.      Pulses: Normal pulses.      Heart sounds: Normal heart sounds. No murmur heard.  Pulmonary:      Effort: Pulmonary effort is  normal. No respiratory distress.      Breath sounds: Wheezing present. No rhonchi or rales.      Comments: Faint inspiratory wheezing throughout   Abdominal:      General: Bowel sounds are normal.      Palpations: Abdomen is soft.      Tenderness: There is no abdominal tenderness. There is no guarding.   Musculoskeletal:      Right lower leg: No edema.      Left lower leg: No edema.   Skin:     General: Skin is warm and dry.      Coloration: Skin is not jaundiced.   Neurological:      General: No focal deficit present.      Mental Status: He is alert.      Motor: No weakness.      Gait: Gait normal.   Psychiatric:         Attention and Perception: Attention normal.         Mood and Affect: Mood normal.         Speech: Speech normal.         Behavior: Behavior normal. Behavior is cooperative.         Thought Content: Thought content normal.         Cognition and Memory: Cognition normal.        Assessment:    ICD-10-CM    1. Coronary artery disease involving native coronary artery of native heart with angina pectoris (CMS HCC)  I25.119 THYROID STIMULATING HORMONE (SENSITIVE TSH)     VITAMIN B12     VITAMIN D 25 TOTAL      2. Essential (primary) hypertension  I10 VITAMIN D 25 TOTAL     BASIC METABOLIC PANEL      3. GAD (generalized anxiety disorder)  F41.1 VITAMIN D 25 TOTAL      4. Hyperglycemia  R73.9 HGA1C (HEMOGLOBIN A1C WITH EST AVG GLUCOSE)     LIPID PANEL      5. Screening for prostate cancer  Z12.5 PSA SCREENING      6. Hx of CABG  Z95.1       7. Gastroesophageal reflux disease, unspecified whether esophagitis present  K21.9       8. Vitamin B12 deficiency  E53.8       9. Tobacco abuse  Z72.0            Plan:  Orders Placed This Encounter    THYROID STIMULATING HORMONE (SENSITIVE TSH)    VITAMIN B12    VITAMIN D 25 TOTAL    HGA1C (HEMOGLOBIN A1C WITH EST AVG GLUCOSE)    LIPID PANEL    BASIC METABOLIC PANEL    PSA SCREENING    atorvastatin (LIPITOR) 40 mg Oral Tablet    clopidogreL (PLAVIX) 75 mg Oral Tablet      New pt to establish care.   CAD/hx  CABG/stents: no acute symptoms. BP stable.   GAD/depression:  Symptoms are well controlled with current medication.  He has been on long-term benzodiazepine.  Hyperglycemia:  Mildly elevated on last labs.  A1c to further assess.  GERD:  Symptoms are well controlled with current therapy.  Vitamin B12 deficiency:  Reports compliance of supplement.  Repeat lab and adjust therapy if indicated pending results.  Tobacco abuse:  Encouraged cessation.      Return in about 3 months (around 04/26/2022) for CE with CDM.    Time: 63 min including review of records in expanse/Epic.     Elliot Gurney, FNP-BC     Portions of this note may be dictated using voice recognition software or a dictation service. Variances in spelling and vocabulary are possible and unintentional. Not all errors are caught/corrected. Please notify the Thereasa Parkin if any discrepancies are noted or if the meaning of any statement is not clear.

## 2022-01-31 ENCOUNTER — Encounter (INDEPENDENT_AMBULATORY_CARE_PROVIDER_SITE_OTHER): Payer: Self-pay | Admitting: Family

## 2022-01-31 ENCOUNTER — Encounter (INDEPENDENT_AMBULATORY_CARE_PROVIDER_SITE_OTHER): Payer: Self-pay

## 2022-01-31 ENCOUNTER — Other Ambulatory Visit (INDEPENDENT_AMBULATORY_CARE_PROVIDER_SITE_OTHER): Payer: Self-pay | Admitting: Family

## 2022-01-31 ENCOUNTER — Other Ambulatory Visit: Payer: MEDICAID | Attending: Family | Admitting: Family

## 2022-01-31 ENCOUNTER — Ambulatory Visit (INDEPENDENT_AMBULATORY_CARE_PROVIDER_SITE_OTHER): Payer: Self-pay

## 2022-01-31 ENCOUNTER — Other Ambulatory Visit: Payer: Self-pay

## 2022-01-31 DIAGNOSIS — R739 Hyperglycemia, unspecified: Secondary | ICD-10-CM | POA: Insufficient documentation

## 2022-01-31 DIAGNOSIS — I25119 Atherosclerotic heart disease of native coronary artery with unspecified angina pectoris: Secondary | ICD-10-CM

## 2022-01-31 DIAGNOSIS — N183 Chronic kidney disease, stage 3 unspecified (CMS HCC): Secondary | ICD-10-CM

## 2022-01-31 DIAGNOSIS — F411 Generalized anxiety disorder: Secondary | ICD-10-CM | POA: Insufficient documentation

## 2022-01-31 DIAGNOSIS — Z125 Encounter for screening for malignant neoplasm of prostate: Secondary | ICD-10-CM

## 2022-01-31 DIAGNOSIS — E673 Hypervitaminosis D: Secondary | ICD-10-CM

## 2022-01-31 DIAGNOSIS — N289 Disorder of kidney and ureter, unspecified: Secondary | ICD-10-CM | POA: Insufficient documentation

## 2022-01-31 DIAGNOSIS — I10 Essential (primary) hypertension: Secondary | ICD-10-CM

## 2022-01-31 DIAGNOSIS — N1831 Chronic kidney disease, stage 3a: Secondary | ICD-10-CM | POA: Insufficient documentation

## 2022-01-31 LAB — HGA1C (HEMOGLOBIN A1C WITH EST AVG GLUCOSE): HEMOGLOBIN A1C: 5.4 % (ref 4.0–6.0)

## 2022-01-31 LAB — BASIC METABOLIC PANEL
ANION GAP: 9 mmol/L — ABNORMAL LOW (ref 10–20)
BUN/CREA RATIO: 11 (ref 6–22)
BUN: 17 mg/dL (ref 7–25)
CALCIUM: 9.1 mg/dL (ref 8.6–10.3)
CHLORIDE: 106 mmol/L (ref 98–107)
CO2 TOTAL: 22 mmol/L (ref 21–31)
CREATININE: 1.51 mg/dL — ABNORMAL HIGH (ref 0.60–1.30)
ESTIMATED GFR: 53 mL/min/{1.73_m2} — ABNORMAL LOW (ref >59)
GLUCOSE: 130 mg/dL — ABNORMAL HIGH (ref 74–109)
OSMOLALITY, CALCULATED: 277 mOsm/kg (ref 270–290)
POTASSIUM: 3.4 mmol/L — ABNORMAL LOW (ref 3.5–5.1)
SODIUM: 137 mmol/L (ref 136–145)

## 2022-01-31 LAB — LIPID PANEL
CHOL/HDL RATIO: 2.8
CHOLESTEROL: 110 mg/dL (ref <200)
HDL CHOL: 40 mg/dL (ref 23–92)
LDL CALC: 57 mg/dL (ref 0–100)
TRIGLYCERIDES: 66 mg/dL (ref <=150)
VLDL CALC: 13 mg/dL (ref 0–50)

## 2022-01-31 LAB — THYROID STIMULATING HORMONE (SENSITIVE TSH): TSH: 1.237 u[IU]/mL (ref 0.450–5.330)

## 2022-01-31 LAB — PSA SCREENING: PSA: 0.28 ng/mL (ref <=4.00)

## 2022-01-31 LAB — PARATHYROID HORMONE (PTH): PTH: 72.5 pg/mL (ref 12.0–88.0)

## 2022-01-31 LAB — VITAMIN D 25 TOTAL: VITAMIN D: >120 ng/mL — ABNORMAL HIGH (ref 30–100)

## 2022-01-31 LAB — VITAMIN B12: VITAMIN B 12: 761 pg/mL (ref 180–914)

## 2022-02-01 ENCOUNTER — Other Ambulatory Visit: Payer: Self-pay

## 2022-02-14 ENCOUNTER — Ambulatory Visit (INDEPENDENT_AMBULATORY_CARE_PROVIDER_SITE_OTHER): Payer: MEDICAID | Admitting: NURSE PRACTITIONER

## 2022-02-14 ENCOUNTER — Encounter (INDEPENDENT_AMBULATORY_CARE_PROVIDER_SITE_OTHER): Payer: Self-pay | Admitting: NURSE PRACTITIONER

## 2022-02-14 ENCOUNTER — Other Ambulatory Visit: Payer: Self-pay

## 2022-02-14 VITALS — BP 120/68 | HR 67 | Ht 72.0 in | Wt 198.0 lb

## 2022-02-14 DIAGNOSIS — E785 Hyperlipidemia, unspecified: Secondary | ICD-10-CM

## 2022-02-14 DIAGNOSIS — I255 Ischemic cardiomyopathy: Secondary | ICD-10-CM

## 2022-02-14 DIAGNOSIS — I10 Essential (primary) hypertension: Secondary | ICD-10-CM

## 2022-02-14 DIAGNOSIS — Z720 Tobacco use: Secondary | ICD-10-CM

## 2022-02-14 DIAGNOSIS — I351 Nonrheumatic aortic (valve) insufficiency: Secondary | ICD-10-CM

## 2022-02-14 DIAGNOSIS — I2510 Atherosclerotic heart disease of native coronary artery without angina pectoris: Secondary | ICD-10-CM

## 2022-02-14 NOTE — Progress Notes (Signed)
[facility]    Name: Gabriel Best.  Age: 61 y.o.  Date of Service: 02/14/2022    Primary Care Provider: Benard Rink, FNP-BC  Chief Complaint:   Chief Complaint   Patient presents with    Follow Up     Heart Cath 07.25.23    Hypertension    Hyperlipidemia     Atherosclerotic Heart Disease without angina       Subjective:  Gabriel D Scotti [name]. is a very pleasant 61 y.o. male with a past medical history significant for coronary artery disease.  He had four-vessel bypass at age 70 after MI performed at [facility].  LV function was preserved.  In 2013, the patient had a non-ST elevation MI and underwent cardiac catheterization by Dr. Yvonne Kendall at [facility] in [place].  He was found to have an occlusion in the vein graft to the left circumflex, which was borderline stenosis 60% to 70%.  There was 75% stenosis in the mid RCA that had a patent vein graft and there was also patent graft to the diagonal.  Stress test in 2018 for preoperative assessment was low risk study.  EF was 64% on resting imaging.  There was no TID.  There was a moderate size apical perfusion defect that was largely fixed consistent with previous defect just slightly extended.  Repeat stress test in May 2022 was a normal, low risk study, normal myocardial perfusion imaging.  EF was slightly reduced to 46%. Echocardiogram in March 2022 showed EF 45% with moderate aortic insufficiency.    08/23/21 The patient presents for evaluation after recent ER visit.  The patient had left-sided chest pain.  This is since resolved.  He thinks he maybe strained a muscle.  He was helping his daughter with the cabinets in her kitchen.  He has no symptoms over the last week.  Echocardiogram done less than a week ago appears to be fairly stable although there is moderate aortic insufficiency.    11/03/21 The patient is here due to chest pain.  He states he developed chest pain that woke him up Sunday night.  It started off  on the left side but then radiated to the right side and then midsternal.  Symptoms within radiate up through withdrawal similar to reflux.  He used to take Prilosec for reflux but stopped this several years ago.  Sometimes position change will help with symptoms and he was able to get back to sleep, but then symptoms persisted throughout the day the next day.  He ended up going to the emergency room for evaluation.  His cardiac workup was negative.  His symptoms did improve with a GI cocktail.  He was diagnosed with pneumonia, but chest x-ray showed chronic changes and he had no symptoms related to pneumonia.  He is now on Pepcid and Prilosec, but he continues to have symptoms.    02/14/22 The patient is here for f/u from heart catheterizations, initial cath on 11/23/21 which resulted in PCI to the RCA, staged PCI to the PLB and distal RCA on 12/13/21, and then staged PCI to the SVG to the diagonal as well as to the left main/LCX/OM2.  Chest pain did improve with initial PCI to the RCA.  He states now his shortness of breath is also improving.  He still has some at baseline due to his history of smoking, but he has much more energy now.  He is doing his previous tasks at home  that he was unable to do earlier in the spring.  He feels like he is slowly continuing to improve.    Past Medical History:  Past Medical History:   Diagnosis Date    Anxiety and depression     Atherosclerotic heart disease of native coronary artery without angina pectoris     Chronic back pain     Chronic prostatitis     Urologist Dr Corwin Levins in the past    CKD (chronic kidney disease) stage 3, GFR 30-59 ml/min (CMS HCC)     Coronary disease     Essential hypertension     GERD (gastroesophageal reflux disease)     High vitamin D level     Hyperlipidemia     Renal insufficiency     Tobacco abuse     Vitamin B12 deficiency          Social History:  Social History     Tobacco Use   Smoking Status Former    Packs/day: 1.00    Types: Cigarettes    Quit  date: 01/23/2022    Years since quitting: 0.0   Smokeless Tobacco Never      Social History     Substance and Sexual Activity   Alcohol Use Yes    Comment: rare social use only      Social History     Substance and Sexual Activity   Drug Use Never      Current Medications:  Current Outpatient Medications   Medication Sig    ALPRAZolam (XANAX) 1 mg Oral Tablet Take 1 Tablet (1 mg total) by mouth Twice per day as needed 1/2 to 1 tab prn    aspirin (ECOTRIN) 81 mg Oral Tablet, Delayed Release (E.C.) Take 1 Tablet (81 mg total) by mouth Once a day    atorvastatin (LIPITOR) 40 mg Oral Tablet Take 1 Tablet (40 mg total) by mouth Once a day    citalopram (CELEXA) 20 mg Oral Tablet Take 1 Tablet (20 mg total) by mouth Once a day    clopidogreL (PLAVIX) 75 mg Oral Tablet Take 1 Tablet (75 mg total) by mouth Once a day    cyanocobalamin (VITAMIN B12) 1,000 mcg/mL Injection Solution Inject 1 mL (1,000 mcg total) into the muscle Every 30 days    famotidine (PEPCID) 20 mg Oral Tablet Take 1 Tablet (20 mg total) by mouth Once a day    folic acid 0.8 mg Oral Capsule Take 1 Capsule (0.8 mg total) by mouth Once a day    lisinopriL (PRINIVIL) 5 mg Oral Tablet Take 0.5 Tablets (2.5 mg total) by mouth Once a day    nitroGLYCERIN (NITROSTAT) 0.4 mg Sublingual Tablet, Sublingual Place 1 Tablet (0.4 mg total) under the tongue Every 5 minutes as needed for Chest pain for 3 doses over 15 minutes    omeprazole (PRILOSEC) 20 mg Oral Capsule, Delayed Release(E.C.) Take 1 Capsule (20 mg total) by mouth Once a day    traMADoL (ULTRAM) 50 mg Oral Tablet Take 1 Tablet (50 mg total) by mouth Twice per day as needed for Pain     Allergies:  No Known Allergies   Review of Systems:  Complete ROS was performed and otherwise negative unless noted in HPI.      Vital Signs:  Vitals:    02/14/22 1104   BP: 120/68   Pulse: 67   SpO2: 97%   Weight: 89.8 kg (198 lb)   Height: 1.829 m (6')   BMI: 26.91  Physical Exam:  General: Pt resting comfortably in no  acute distress and appears stated age.    Neck: No JVD, no carotid bruit. Neck supple, symmetrical, trachea midline.   Lungs:  Normal respiratory effort, lungs scattered rhonchi.    Cardiovascular:  Regular rate and rhythm.  Normal S1 and S2 without murmur, gallop, or rub.  Abdomen: Soft, non-tender and bowel sounds normal.    Extremities: Extremities normal, atraumatic, no cyanosis or edema.    Neurologic: Alert and oriented x3.     Assessment:    Atherosclerotic heart disease of native coronary artery without angina pectoris    Ischemic cardiomyopathy    Nonrheumatic aortic valve insufficiency    Essential hypertension    Hyperlipidemia, unspecified hyperlipidemia type    Tobacco use      Plan:   Patient has done well since PCI.  His symptoms are improved and basically back to baseline.  He is still smoking, strongly encouraged smoking cessation.  He did have lab work last week with PCP, creatinine was stable at 1.51.  Total cholesterol 110, triglycerides 66, HDL 40, LDL 57.  We will plan for follow-up in 6 months.    Orders placed this visit:  Orders Placed This Encounter    EKG (In-Clinic Today)       Gabriel Nutting D Grosch Montez Hageman. is to return to clinic for follow up with the understanding that should symptoms change or worsen he is to call the office or go to the closest emergency department for evaluation.    Gabriel Kinsman, APRN,FNP-BC    A portion of this documentation may have been generated using MMODAL voice recognition software and may contain syntax/voice recognition errors.

## 2022-02-19 ENCOUNTER — Other Ambulatory Visit (INDEPENDENT_AMBULATORY_CARE_PROVIDER_SITE_OTHER): Payer: Self-pay | Admitting: NURSE PRACTITIONER

## 2022-02-21 ENCOUNTER — Encounter (INDEPENDENT_AMBULATORY_CARE_PROVIDER_SITE_OTHER): Payer: Self-pay | Admitting: INTERVENTIONAL CARDIOLOGY

## 2022-03-27 ENCOUNTER — Other Ambulatory Visit (INDEPENDENT_AMBULATORY_CARE_PROVIDER_SITE_OTHER): Payer: Self-pay | Admitting: Family

## 2022-03-27 MED ORDER — CITALOPRAM 20 MG TABLET
20.0000 mg | ORAL_TABLET | Freq: Every day | ORAL | 1 refills | Status: DC
Start: 2022-03-27 — End: 2022-09-25

## 2022-03-27 MED ORDER — CYANOCOBALAMIN (VIT B-12) 1,000 MCG/ML INJECTION SOLUTION
1000.0000 ug | INTRAMUSCULAR | 1 refills | Status: DC
Start: 2022-03-27 — End: 2022-09-25

## 2022-04-26 LAB — ECG W INTERP (AMB USE ONLY)(MUSE,IN CLINIC)
Atrial Rate: 69 {beats}/min
Calculated P Axis: 79 degrees
Calculated R Axis: 62 degrees
Calculated T Axis: 80 degrees
PR Interval: 154 ms
QRS Duration: 92 ms
QT Interval: 406 ms
QTC Calculation: 435 ms
Ventricular rate: 69 {beats}/min

## 2022-04-27 ENCOUNTER — Ambulatory Visit (INDEPENDENT_AMBULATORY_CARE_PROVIDER_SITE_OTHER): Payer: MEDICAID | Admitting: Family

## 2022-04-27 ENCOUNTER — Encounter (INDEPENDENT_AMBULATORY_CARE_PROVIDER_SITE_OTHER): Payer: Self-pay | Admitting: Family

## 2022-04-27 ENCOUNTER — Other Ambulatory Visit: Payer: MEDICAID | Attending: Family | Admitting: Family

## 2022-04-27 ENCOUNTER — Other Ambulatory Visit: Payer: Self-pay

## 2022-04-27 VITALS — BP 107/66 | HR 78 | Temp 99.3°F | Ht 72.0 in | Wt 199.0 lb

## 2022-04-27 DIAGNOSIS — Z0000 Encounter for general adult medical examination without abnormal findings: Secondary | ICD-10-CM | POA: Insufficient documentation

## 2022-04-27 DIAGNOSIS — E538 Deficiency of other specified B group vitamins: Secondary | ICD-10-CM

## 2022-04-27 DIAGNOSIS — F17210 Nicotine dependence, cigarettes, uncomplicated: Secondary | ICD-10-CM

## 2022-04-27 DIAGNOSIS — J069 Acute upper respiratory infection, unspecified: Secondary | ICD-10-CM

## 2022-04-27 DIAGNOSIS — Z0001 Encounter for general adult medical examination with abnormal findings: Secondary | ICD-10-CM

## 2022-04-27 DIAGNOSIS — Z720 Tobacco use: Secondary | ICD-10-CM

## 2022-04-27 DIAGNOSIS — R062 Wheezing: Secondary | ICD-10-CM

## 2022-04-27 DIAGNOSIS — R059 Cough, unspecified: Secondary | ICD-10-CM

## 2022-04-27 DIAGNOSIS — N183 Chronic kidney disease, stage 3 unspecified (CMS HCC): Secondary | ICD-10-CM

## 2022-04-27 DIAGNOSIS — E559 Vitamin D deficiency, unspecified: Secondary | ICD-10-CM

## 2022-04-27 DIAGNOSIS — I129 Hypertensive chronic kidney disease with stage 1 through stage 4 chronic kidney disease, or unspecified chronic kidney disease: Secondary | ICD-10-CM

## 2022-04-27 DIAGNOSIS — Z951 Presence of aortocoronary bypass graft: Secondary | ICD-10-CM

## 2022-04-27 DIAGNOSIS — E673 Hypervitaminosis D: Secondary | ICD-10-CM | POA: Insufficient documentation

## 2022-04-27 DIAGNOSIS — J029 Acute pharyngitis, unspecified: Secondary | ICD-10-CM

## 2022-04-27 DIAGNOSIS — I25119 Atherosclerotic heart disease of native coronary artery with unspecified angina pectoris: Secondary | ICD-10-CM

## 2022-04-27 DIAGNOSIS — I10 Essential (primary) hypertension: Secondary | ICD-10-CM

## 2022-04-27 DIAGNOSIS — K219 Gastro-esophageal reflux disease without esophagitis: Secondary | ICD-10-CM

## 2022-04-27 DIAGNOSIS — M4802 Spinal stenosis, cervical region: Secondary | ICD-10-CM

## 2022-04-27 LAB — CBC WITH DIFF
BASOPHIL #: 0 10*3/uL (ref 0.00–0.10)
BASOPHIL %: 1 % (ref 0–1)
EOSINOPHIL #: 0.2 10*3/uL (ref 0.00–0.50)
EOSINOPHIL %: 3 %
HCT: 44.9 % (ref 36.7–47.1)
HGB: 15.2 g/dL (ref 12.5–16.3)
LYMPHOCYTE #: 1.6 10*3/uL (ref 1.00–3.00)
LYMPHOCYTE %: 28 % (ref 16–44)
MCH: 30.9 pg (ref 23.8–33.4)
MCHC: 33.9 g/dL (ref 32.5–36.3)
MCV: 91.3 fL (ref 73.0–96.2)
MONOCYTE #: 0.6 10*3/uL (ref 0.30–1.00)
MONOCYTE %: 11 % (ref 5–13)
MPV: 9.8 fL (ref 7.4–11.4)
NEUTROPHIL #: 3.3 10*3/uL (ref 1.85–7.80)
NEUTROPHIL %: 58 % (ref 43–77)
PLATELETS: 132 10*3/uL — ABNORMAL LOW (ref 140–440)
RBC: 4.92 10*6/uL (ref 4.06–5.63)
RDW: 13.9 % (ref 12.1–16.2)
WBC: 5.6 10*3/uL (ref 3.6–10.2)

## 2022-04-27 LAB — BASIC METABOLIC PANEL
ANION GAP: 8 mmol/L (ref 4–13)
BUN/CREA RATIO: 12 (ref 6–22)
BUN: 18 mg/dL (ref 7–25)
CALCIUM: 8.8 mg/dL (ref 8.6–10.3)
CHLORIDE: 106 mmol/L (ref 98–107)
CO2 TOTAL: 25 mmol/L (ref 21–31)
CREATININE: 1.48 mg/dL — ABNORMAL HIGH (ref 0.60–1.30)
ESTIMATED GFR: 54 mL/min/{1.73_m2} — ABNORMAL LOW (ref >59)
GLUCOSE: 92 mg/dL (ref 74–109)
OSMOLALITY, CALCULATED: 279 mOsm/kg (ref 270–290)
POTASSIUM: 3.6 mmol/L (ref 3.5–5.1)
SODIUM: 139 mmol/L (ref 136–145)

## 2022-04-27 LAB — HEPATIC FUNCTION PANEL
ALBUMIN/GLOBULIN RATIO: 1.3 (ref 0.8–1.4)
ALBUMIN: 3.9 g/dL (ref 3.5–5.7)
ALKALINE PHOSPHATASE: 96 U/L (ref 34–104)
ALT (SGPT): 8 U/L (ref 7–52)
AST (SGOT): 15 U/L (ref 13–39)
BILIRUBIN DIRECT: 0.19 md/dL (ref <=0.20)
BILIRUBIN TOTAL: 0.8 mg/dL (ref 0.3–1.2)
BILIRUBIN, INDIRECT: 0.61 mg/dL (ref <=1)
GLOBULIN: 2.9 (ref 2.9–5.4)
PROTEIN TOTAL: 6.8 g/dL (ref 6.4–8.9)

## 2022-04-27 LAB — POCT RAPID COVID-19 & FLU (AMB ONLY)
COVID-19 AG: NEGATIVE
INFLUENZA TYPE A: NEGATIVE
INFLUENZA TYPE B: NEGATIVE

## 2022-04-27 LAB — POCT RAPID STREP A: RAPID STREP A (POCT): NEGATIVE

## 2022-04-27 LAB — VITAMIN D 25 TOTAL: VITAMIN D: 96 ng/mL (ref 30–100)

## 2022-04-27 MED ORDER — ALPRAZOLAM 1 MG TABLET
1.0000 mg | ORAL_TABLET | Freq: Two times a day (BID) | ORAL | 2 refills | Status: DC | PRN
Start: 2022-04-27 — End: 2022-10-06

## 2022-04-27 MED ORDER — PROMETHAZINE-DM 6.25 MG-15 MG/5 ML ORAL SYRUP
5.0000 mL | ORAL_SOLUTION | Freq: Four times a day (QID) | ORAL | 0 refills | Status: DC | PRN
Start: 2022-04-27 — End: 2022-05-09

## 2022-04-27 MED ORDER — AMOXICILLIN 875 MG-POTASSIUM CLAVULANATE 125 MG TABLET
1.0000 | ORAL_TABLET | Freq: Two times a day (BID) | ORAL | 0 refills | Status: DC
Start: 2022-04-27 — End: 2022-05-09

## 2022-04-27 MED ORDER — ALBUTEROL SULFATE HFA 90 MCG/ACTUATION AEROSOL INHALER
1.0000 | INHALATION_SPRAY | Freq: Four times a day (QID) | RESPIRATORY_TRACT | 0 refills | Status: DC | PRN
Start: 2022-04-27 — End: 2024-01-01

## 2022-04-27 NOTE — Nursing Note (Signed)
04/27/22 0920   Comprehensive Health Assessment-Adult   Do you wish to complete this form? Yes   During the past 4 weeks, how would you rate your health in general? Very Good   During the past 4 weeks, how much difficulty have you had doing your usual activities inside and outside your home because of medical or emotional problems? No difficulty at all   During the past 4 weeks, was someone available to help you if you needed and wanted help? Yes, as much as I wanted   In the past year, how many times have you gone to the emergency department or been admitted to a hospital for a health problem? 5 times or more   Are you generally satisfied with your sleep? No   Do you have enough money to buy things you need in everyday life, such as food, clothing, medicines, and housing? Yes, always   Can you get to places beyond walking distance without help?  (For example, can you drive your own car or travel alone on buses)? Yes   Do you fasten your seatbelt when you are in a car? Yes, sometimes   Do you exercise 20 minutes 3 or more days per week (such as walking, dancing, biking, mowing grass, swimming)? Yes, most of the time   How often do you eat food that is healthy (fruits, vegetables, lean meats) instead of unhealthy (sweets, fast food, junk food, fatty foods)? Almost always   Have your parents, brothers or sisters had any of the following problems before the age of 38? (check all that apply) Heart problems, or hardening of the arteries;High cholesterol;Cancer;Alcohol or drug addiction (or abuse)   How often do you have trouble taking medicines the eay you are told to take them? I always take them as prescribed   Do you need any help communicating with your doctors and nurses because of vision or hearing problems? No   During the past 12 months, have you experienced confusion or memory loss that is happening more often or is getting worse? No   Do you have one person you think of as your personal doctor (primary care  provider or family doctor)? Yes   If you are seeing a Primary Care Provider (PCP) or family doctor. please list their name Caron Presume   Are you now also seeing any specialist physician(s) (such as eye doctor, foot doctor, skin doctor)? Yes   If you are seeing a specialist for anything such as foot, eye, skin, etc.  please list their name(s) Cardiologist   How confident are you that you can control or manage most of your health problems? Somewhat confident

## 2022-04-27 NOTE — Progress Notes (Signed)
FAMILY MEDICINE, MEDICAL OFFICE BUILDING  [address]  [place] [address]          Name: Gabriel Best. MRN:  [id number]   Date: 04/27/2022 Age: 62 y.o.          Provider: Elliot Gurney, FNP-BC    Reason for visit: Annual Exam      History of Present Illness:  Gabriel D Mcgeehan Montez Hageman. is a 61 y.o. male presenting for annual exam.   No family h/o breast, colon, or prostate cancer.     Acute illness since yesterday. Family members sick as well. Daughter and SIL sick. Grand-daughter has RSV. Sister in quarantine for covid, his last contact with her was 2 weeks ago.     Extensive cardiac history, following with cardiologist.   Nov 2023: no acute symptoms. No recent blood donation.   Most recent cardiac cath July 2023. Chronic DOE.   Has had 3 PCI with total of 9 stents.   H/o MI and CABG.   H/o blood donation monthly d/t elevated Hgb.   Meds:   NTG PRN- has not had to use since last stent  Aspirin 81 mg daily  Atorvastatin 40 mg daily   Clopidogrel 75 mg daily  Lisinopril 5mg  1/2 tab daily   Previous amlodipine, he reports Dr changed to the lisinopril (still showing on pharmacy)  Consults: Dr Elesa Massed     Anxiety  Onset: age 34s  Nov 2023: stable with med.   Aug 2023: long-standing use of alprazolam d/t h/o anxiety attacks. He ran out recently during change in PCP and had exacerbation of symptoms. I reviewed risks of withdrawals. BOP consistent, no indication of abuse. Denies SI. No SE of meds. No respiratory depression. He waits at least 1 hour between alprazolam and tramadol when he uses it.   Meds:   Alprazolam 1 mg (1/2 to 1 tab) BID  Citalopram 20 mg daily  Consults: none      GERD  Aug 2023: no heartburn with medication. Was having significant symptoms prior to medication. No black/blood in stool.   Mag 2.0 Feb 2023  Investigations:   No EGD that he is aware of  Colonoscopy Winter 2022  Meds:   Famotidine 20 mg daily  Omeprazole 20 mg daily     Vitamin B12 Def  B12: 716 Aug 2023  Med:  Monthly  injections (self administers)   Folate     Elevated vitamin D  Lab: > 120 Aug 2023  PTH 72.21 Jan 2022  Nov 2023: did not do the 1 month repeat. Denies supplement. Consider BMD if still elevated.      Chronic back pain/DDD/bil knee pain  Hx: cervical fusion, bil knee surgery   Nov 2023: using tramadol PRN only, uses sparingly. He feels 2 apap may be just as effective. Activity as tolerated.   Med: tramadol PRN only, usually only takes 1 tab a couple of times a week   Was on Lortab previously.      Tobacco Use  Nov 2023: decreased smoking x couple of months, occ dips. Not having a cigarette everyday. Has a dip most days with activity such as walking dog.   Aug 2023: he is motivated to try to quit. Candy helps. He had gotten down to 4-5 cig daily until he ran out of alprazolam and resumed 1 ppd. Verbalized intent to decrease.   Onset: age 34 Quit x 11 years at age 48 after MI. Restarted age 87 during  divorce. Avg 1 PPD.        Historical Data    Past Medical History:  Past Medical History:   Diagnosis Date    Anxiety and depression     Atherosclerotic heart disease of native coronary artery without angina pectoris     Chronic back pain     Chronic prostatitis     Urologist Dr Corwin Levins in the past    CKD (chronic kidney disease) stage 3, GFR 30-59 ml/min (CMS HCC)     Coronary disease     Essential hypertension     GERD (gastroesophageal reflux disease)     High vitamin D level     Hyperlipidemia     Renal insufficiency     Tobacco abuse     Vitamin B12 deficiency          Past Surgical History:  Past Surgical History:   Procedure Laterality Date    CARDIAC CATHETERIZATION  06/19/2012    HX CORONARY ARTERY BYPASS GRAFT      HX KNEE SURGERY Bilateral     Dr Lindwood Qua    HX LAP CHOLECYSTECTOMY      NECK SURGERY      Vertebraes fused back together         Allergies:  No Known Allergies  Medications:  Current Outpatient Medications   Medication Sig    albuterol sulfate (PROVENTIL OR VENTOLIN OR PROAIR) 90 mcg/actuation  Inhalation oral inhaler Take 1-2 Puffs by inhalation Every 6 hours as needed    ALPRAZolam (XANAX) 1 mg Oral Tablet Take 1 Tablet (1 mg total) by mouth Twice per day as needed 1/2 to 1 tab prn    amoxicillin-pot clavulanate (AUGMENTIN) 875-125 mg Oral Tablet Take 1 Tablet by mouth Twice daily for 7 days    aspirin (ECOTRIN) 81 mg Oral Tablet, Delayed Release (E.C.) Take 1 Tablet (81 mg total) by mouth Once a day    atorvastatin (LIPITOR) 40 mg Oral Tablet TAKE 1 TABLET BY MOUTH ONCE DAILY FOR CHOLESTEROL    citalopram (CELEXA) 20 mg Oral Tablet Take 1 Tablet (20 mg total) by mouth Once a day    clopidogreL (PLAVIX) 75 mg Oral Tablet Take 1 tablet by mouth once daily    cyanocobalamin (VITAMIN B12) 1,000 mcg/mL Injection Solution Inject 1 mL (1,000 mcg total) into the muscle Every 30 days    famotidine (PEPCID) 20 mg Oral Tablet Take 1 Tablet (20 mg total) by mouth Once a day    folic acid 0.8 mg Oral Capsule Take 1 Capsule (0.8 mg total) by mouth Once a day    lisinopriL (PRINIVIL) 5 mg Oral Tablet Take 0.5 Tablets (2.5 mg total) by mouth Once a day    nitroGLYCERIN (NITROSTAT) 0.4 mg Sublingual Tablet, Sublingual Place 1 Tablet (0.4 mg total) under the tongue Every 5 minutes as needed for Chest pain for 3 doses over 15 minutes    omeprazole (PRILOSEC) 20 mg Oral Capsule, Delayed Release(E.C.) Take 1 Capsule (20 mg total) by mouth Once a day    promethazine-dextromethorphan (PHENERGAN-DM) 6.25-15 mg/5 mL Oral Syrup Take 5 mL by mouth Four times a day as needed for Cough for up to 6 days    traMADoL (ULTRAM) 50 mg Oral Tablet Take 1 Tablet (50 mg total) by mouth Twice per day as needed for Pain     Family History:  Family Medical History:       Problem Relation (Age of Onset)    Cancer Mother  Coronary Artery Disease Father            Social History:  Social History     Socioeconomic History    Marital status: Divorced   Occupational History    Occupation: disabled d/t back   Tobacco Use    Smoking status: Former      Packs/day: 1     Types: Cigarettes     Quit date: 01/23/2022     Years since quitting: 0.2    Smokeless tobacco: Never   Vaping Use    Vaping Use: Never used   Substance and Sexual Activity    Alcohol use: Yes     Comment: rare social use only    Drug use: Never           Review of Systems:  Review of Systems   Constitutional:  Positive for malaise/fatigue. Negative for fever.   HENT:  Positive for congestion, sinus pain and sore throat (first thing of AM). Negative for ear pain.    Eyes:  Negative for blurred vision and double vision.   Respiratory:  Positive for cough and sputum production (clear, occ yellow). Negative for shortness of breath.    Cardiovascular:  Negative for chest pain and palpitations.   Musculoskeletal:  Positive for myalgias ("just a little, not much").   Skin:  Negative for rash.   Neurological:  Positive for headaches (with coughing).        Physical Exam:  Vital Signs:  Vitals:    04/27/22 0923   BP: 107/66   Pulse: 78   Temp: 37.4 C (99.3 F)   TempSrc: Temporal   SpO2: 95%   Weight: 90.3 kg (199 lb)   Height: 1.829 m (6')   BMI: 27.05     Physical Exam  Vitals and nursing note reviewed.   Constitutional:       General: He is not in acute distress.     Appearance: Normal appearance. He is normal weight. He is ill-appearing (chronically).      Comments: Tired appearing    HENT:      Head: Normocephalic.      Right Ear: Tympanic membrane, ear canal and external ear normal.      Left Ear: Tympanic membrane, ear canal and external ear normal.      Nose: Rhinorrhea present. Rhinorrhea is clear.      Mouth/Throat:      Mouth: Mucous membranes are moist.      Pharynx: No pharyngeal swelling or posterior oropharyngeal erythema.      Comments: Yellow discoloration of tongue, most likely r/t tobacco use; PND  Eyes:      General: Lids are normal. No scleral icterus.     Extraocular Movements: Extraocular movements intact.      Conjunctiva/sclera: Conjunctivae normal.      Pupils: Pupils are equal,  round, and reactive to light.   Neck:      Vascular: No carotid bruit.   Cardiovascular:      Rate and Rhythm: Normal rate and regular rhythm.      Pulses: Normal pulses.      Heart sounds: Normal heart sounds. No murmur heard.  Pulmonary:      Effort: Pulmonary effort is normal. No accessory muscle usage or respiratory distress.      Breath sounds: Wheezing present. No rhonchi or rales.      Comments: Faint inspiratory wheezes throughout   Abdominal:      General: Bowel sounds are normal.  Palpations: Abdomen is soft.      Tenderness: There is no guarding.   Musculoskeletal:      Right lower leg: No edema.      Left lower leg: No edema.   Lymphadenopathy:      Cervical: No cervical adenopathy.      Upper Body:      Right upper body: No supraclavicular adenopathy.      Left upper body: No supraclavicular adenopathy.   Skin:     General: Skin is warm and dry.      Coloration: Skin is not jaundiced.   Neurological:      General: No focal deficit present.      Mental Status: He is alert. Mental status is at baseline.   Psychiatric:         Attention and Perception: Attention normal.         Mood and Affect: Mood normal.         Speech: Speech normal.         Behavior: Behavior normal. Behavior is cooperative.          Assessment:    ICD-10-CM    1. Sore throat  J02.9 POCT Rapid Strep     THROAT CULTURE, BETA HEMOLYTIC STREPTOCOCCUS      2. Cough, unspecified type  R05.9 POCT Rapid Covid & Flu ([facility])      3. Encounter for well adult exam without abnormal findings  Z00.00 CBC/DIFF     BASIC METABOLIC PANEL     HEPATIC FUNCTION PANEL      4. High vitamin D level  E67.3 VITAMIN D 25 TOTAL      5. Coronary artery disease involving native coronary artery of native heart with angina pectoris (CMS HCC)  I25.119       6. Essential (primary) hypertension  I10       7. Hx of CABG  Z95.1       8. Tobacco abuse  Z72.0       9. CKD (chronic kidney disease) stage 3, GFR 30-59 ml/min (CMS HCC)  N18.30       10. Gastroesophageal  reflux disease, unspecified whether esophagitis present  K21.9       11. Vitamin B12 deficiency  E53.8       12. Spinal stenosis in cervical region  M48.02            Plan:  Orders Placed This Encounter    THROAT CULTURE, BETA HEMOLYTIC STREPTOCOCCUS    CBC/DIFF    BASIC METABOLIC PANEL    HEPATIC FUNCTION PANEL    VITAMIN D 25 TOTAL    CBC WITH DIFF    POCT Rapid Covid & Flu Keenan Bachelor)    POCT Rapid Strep    promethazine-dextromethorphan (PHENERGAN-DM) 6.25-15 mg/5 mL Oral Syrup    amoxicillin-pot clavulanate (AUGMENTIN) 875-125 mg Oral Tablet    albuterol sulfate (PROVENTIL OR VENTOLIN OR PROAIR) 90 mcg/actuation Inhalation oral inhaler    ALPRAZolam (XANAX) 1 mg Oral Tablet     General medical exam.   2 lb wt gain.   Reviewed previous labs and additional ordered.   No immunizations today d/t acute illness.   URI with wheezing: acute treatment as directed. Reviewed use of inhaler.   Chronic medical conditions reviewed and updated per HPI.   PSA 0.13 Feb 2022  Colonoscopy Winter 2022.   Consider BMD if vitamin D remains elevated. No symptoms of toxicity.          Return  in about 4 months (around 08/26/2022) for CDM; 1 year CE .    Elliot Gurney, FNP-BC     Portions of this note may be dictated using voice recognition software or a dictation service. Variances in spelling and vocabulary are possible and unintentional. Not all errors are caught/corrected. Please notify the [facility] Parkin if any discrepancies are noted or if the meaning of any statement is not clear.
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
## 2022-04-28 ENCOUNTER — Encounter (INDEPENDENT_AMBULATORY_CARE_PROVIDER_SITE_OTHER): Payer: Self-pay | Admitting: Family

## 2022-04-28 LAB — THROAT CULTURE, BETA HEMOLYTIC STREPTOCOCCUS: THROAT CULTURE: NORMAL

## 2022-04-29 DIAGNOSIS — I509 Heart failure, unspecified: Secondary | ICD-10-CM

## 2022-04-29 DIAGNOSIS — R001 Bradycardia, unspecified: Secondary | ICD-10-CM

## 2022-04-29 DIAGNOSIS — I25119 Atherosclerotic heart disease of native coronary artery with unspecified angina pectoris: Secondary | ICD-10-CM

## 2022-04-29 LAB — ECG 12 LEAD
Atrial Rate: 51 {beats}/min
Atrial Rate: 55 {beats}/min
Atrial Rate: 64 {beats}/min
Atrial Rate: 59 {beats}/min
Calculated P Axis: 51 degrees
Calculated P Axis: 51 degrees
Calculated P Axis: 54 degrees
Calculated P Axis: 41 degrees
Calculated R Axis: 37 degrees
Calculated R Axis: 58 degrees
Calculated R Axis: 70 degrees
Calculated R Axis: -3 degrees
Calculated T Axis: -33 degrees
Calculated T Axis: -43 degrees
Calculated T Axis: -51 degrees
Calculated T Axis: -33 degrees
PR Interval: 146 ms
PR Interval: 148 ms
PR Interval: 154 ms
PR Interval: 152 ms
QRS Duration: 90 ms
QRS Duration: 92 ms
QRS Duration: 92 ms
QRS Duration: 96 ms
QT Interval: 420 ms
QT Interval: 476 ms
QT Interval: 492 ms
QT Interval: 448 ms
QTC Calculation: 433 ms
QTC Calculation: 453 ms
QTC Calculation: 455 ms
QTC Calculation: 443 ms
Ventricular rate: 51 {beats}/min
Ventricular rate: 55 {beats}/min
Ventricular rate: 64 {beats}/min
Ventricular rate: 59 {beats}/min

## 2022-05-03 LAB — ECG W INTERP (AMB USE ONLY)(MUSE,IN CLINIC)
Atrial Rate: 70 {beats}/min
Calculated P Axis: 64 degrees
Calculated R Axis: -6 degrees
Calculated T Axis: -29 degrees
PR Interval: 152 ms
QRS Duration: 86 ms
QT Interval: 410 ms
QTC Calculation: 442 ms
Ventricular rate: 70 {beats}/min

## 2022-05-09 ENCOUNTER — Other Ambulatory Visit: Payer: Self-pay

## 2022-05-09 ENCOUNTER — Inpatient Hospital Stay
Admission: RE | Admit: 2022-05-09 | Discharge: 2022-05-09 | Disposition: A | Discharge status: Home / Self Care | Payer: MEDICAID | Source: Ambulatory Visit | Attending: Internal Medicine | Admitting: Internal Medicine

## 2022-05-09 ENCOUNTER — Ambulatory Visit (INDEPENDENT_AMBULATORY_CARE_PROVIDER_SITE_OTHER): Payer: MEDICAID | Admitting: Internal Medicine

## 2022-05-09 ENCOUNTER — Other Ambulatory Visit (INDEPENDENT_AMBULATORY_CARE_PROVIDER_SITE_OTHER): Payer: Self-pay | Admitting: Internal Medicine

## 2022-05-09 ENCOUNTER — Encounter (INDEPENDENT_AMBULATORY_CARE_PROVIDER_SITE_OTHER): Payer: Self-pay | Admitting: Internal Medicine

## 2022-05-09 VITALS — BP 117/73 | HR 75 | Temp 98.1°F | Ht 72.0 in | Wt 193.8 lb

## 2022-05-09 DIAGNOSIS — S2239XA Fracture of one rib, unspecified side, initial encounter for closed fracture: Secondary | ICD-10-CM | POA: Insufficient documentation

## 2022-05-09 DIAGNOSIS — R0782 Intercostal pain: Secondary | ICD-10-CM

## 2022-05-09 DIAGNOSIS — S20214A Contusion of middle front wall of thorax, initial encounter: Secondary | ICD-10-CM

## 2022-05-09 DIAGNOSIS — R0789 Other chest pain: Secondary | ICD-10-CM

## 2022-05-09 DIAGNOSIS — R001 Bradycardia, unspecified: Secondary | ICD-10-CM

## 2022-05-09 LAB — ECG 12 LEAD
Atrial Rate: 50 {beats}/min
Calculated P Axis: 45 degrees
Calculated R Axis: 5 degrees
Calculated T Axis: 5 degrees
PR Interval: 156 ms
QRS Duration: 92 ms
QT Interval: 486 ms
QTC Calculation: 443 ms
Ventricular rate: 50 {beats}/min
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
## 2022-05-09 NOTE — Progress Notes (Signed)
[place], MEDICAL OFFICE BUILDING    Progress Note    Name: Gabriel Best. MRN:  [id number]   Date: 05/09/2022 Age: 61 y.o.       Chief Complaint: Rib Injury (Bruised left ribs)    Subjective: Quadruple bypass age 48, 9 stents June-July [facility]    Pt still smoking until this happened.     Pt having trouble sleeping due to the pain in his side.      He has taken Tramadol for back issues.  He is disabled due to his back problem. He has a few Tramadol left.      Objective :  BP 117/73 (Site: Right, Patient Position: Sitting, Cuff Size: Adult)   Pulse 75   Temp 36.7 C (98.1 F) (Temporal)   Ht 1.829 m (6')   Wt 87.9 kg (193 lb 12.8 oz)   SpO2 95%   BMI 26.28 kg/m     Lungs- pt not able to take a deep breath due to pain left lateral chest. No rales or wheezing.   There is bruising of the left lower chest onto upper left abdominal area.  He has no abdominal pain.  Cor-  RRR     Data reviewed:    Current Outpatient Medications   Medication Sig    albuterol sulfate (PROVENTIL OR VENTOLIN OR PROAIR) 90 mcg/actuation Inhalation oral inhaler Take 1-2 Puffs by inhalation Every 6 hours as needed    ALPRAZolam (XANAX) 1 mg Oral Tablet Take 1 Tablet (1 mg total) by mouth Twice per day as needed 1/2 to 1 tab prn    aspirin (ECOTRIN) 81 mg Oral Tablet, Delayed Release (E.C.) Take 1 Tablet (81 mg total) by mouth Once a day    atorvastatin (LIPITOR) 40 mg Oral Tablet TAKE 1 TABLET BY MOUTH ONCE DAILY FOR CHOLESTEROL    citalopram (CELEXA) 20 mg Oral Tablet Take 1 Tablet (20 mg total) by mouth Once a day    clopidogreL (PLAVIX) 75 mg Oral Tablet Take 1 tablet by mouth once daily    cyanocobalamin (VITAMIN B12) 1,000 mcg/mL Injection Solution Inject 1 mL (1,000 mcg total) into the muscle Every 30 days    famotidine (PEPCID) 20 mg Oral Tablet Take 1 Tablet (20 mg total) by mouth Once a day    folic acid 0.8 mg Oral Capsule Take 1 Capsule (0.8 mg total) by mouth Once a day    lisinopriL (PRINIVIL) 5 mg Oral  Tablet Take 0.5 Tablets (2.5 mg total) by mouth Once a day    nitroGLYCERIN (NITROSTAT) 0.4 mg Sublingual Tablet, Sublingual Place 1 Tablet (0.4 mg total) under the tongue Every 5 minutes as needed for Chest pain for 3 doses over 15 minutes    omeprazole (PRILOSEC) 20 mg Oral Capsule, Delayed Release(E.C.) Take 1 Capsule (20 mg total) by mouth Once a day    traMADoL (ULTRAM) 50 mg Oral Tablet Take 1 Tablet (50 mg total) by mouth Twice per day as needed for Pain     Assessment/Plan  Problem List Items Addressed This Visit    None  Visit Diagnoses       Rib fracture    -  Primary    Relevant Orders    XR CHEST PA AND LATERAL    XR RIBS LEFT W PA/AP CHEST          Plan:  Pt did not feel he needed any additional Tramadol or cough syrup.    Avoid smoking !!!!  Keep all of his usual appt    Estill Dooms, MD

## 2022-05-14 DIAGNOSIS — R001 Bradycardia, unspecified: Secondary | ICD-10-CM

## 2022-05-14 DIAGNOSIS — R9431 Abnormal electrocardiogram [ECG] [EKG]: Secondary | ICD-10-CM

## 2022-05-14 LAB — ECG 12 LEAD
Atrial Rate: 54 {beats}/min
Atrial Rate: 58 {beats}/min
Calculated P Axis: 49 degrees
Calculated P Axis: 55 degrees
Calculated R Axis: -4 degrees
Calculated R Axis: 19 degrees
Calculated T Axis: -37 degrees
Calculated T Axis: -41 degrees
PR Interval: 134 ms
PR Interval: 142 ms
QRS Duration: 90 ms
QRS Duration: 90 ms
QT Interval: 458 ms
QT Interval: 462 ms
QTC Calculation: 438 ms
QTC Calculation: 449 ms
Ventricular rate: 54 {beats}/min
Ventricular rate: 58 {beats}/min

## 2022-06-28 ENCOUNTER — Other Ambulatory Visit (INDEPENDENT_AMBULATORY_CARE_PROVIDER_SITE_OTHER): Payer: Self-pay | Admitting: NURSE PRACTITIONER

## 2022-08-15 ENCOUNTER — Ambulatory Visit (INDEPENDENT_AMBULATORY_CARE_PROVIDER_SITE_OTHER): Payer: MEDICAID | Admitting: NURSE PRACTITIONER

## 2022-08-15 ENCOUNTER — Other Ambulatory Visit: Payer: Self-pay

## 2022-08-15 ENCOUNTER — Encounter (INDEPENDENT_AMBULATORY_CARE_PROVIDER_SITE_OTHER): Payer: Self-pay | Admitting: NURSE PRACTITIONER

## 2022-08-15 VITALS — BP 129/63 | HR 60 | Ht 72.0 in | Wt 204.0 lb

## 2022-08-15 DIAGNOSIS — I351 Nonrheumatic aortic (valve) insufficiency: Secondary | ICD-10-CM

## 2022-08-15 DIAGNOSIS — R001 Bradycardia, unspecified: Secondary | ICD-10-CM

## 2022-08-15 DIAGNOSIS — Z720 Tobacco use: Secondary | ICD-10-CM

## 2022-08-15 DIAGNOSIS — E785 Hyperlipidemia, unspecified: Secondary | ICD-10-CM

## 2022-08-15 DIAGNOSIS — I255 Ischemic cardiomyopathy: Secondary | ICD-10-CM

## 2022-08-15 DIAGNOSIS — I2510 Atherosclerotic heart disease of native coronary artery without angina pectoris: Secondary | ICD-10-CM

## 2022-08-15 DIAGNOSIS — I10 Essential (primary) hypertension: Secondary | ICD-10-CM

## 2022-08-15 MED ORDER — LOSARTAN 25 MG TABLET
25.0000 mg | ORAL_TABLET | Freq: Every day | ORAL | 4 refills | Status: DC
Start: 2022-08-15 — End: 2023-02-13

## 2022-08-15 NOTE — Progress Notes (Signed)
Cardiology [facility]    Name: Gabriel Best.  Age: 62 y.o.  Date of Service: 08/15/2022    Primary Care Provider: Benard Rink, FNP-BC  Chief Complaint:   Chief Complaint   Patient presents with    Follow Up 6 Months     Atherosclerotic Heart Disease without Angina  Ischemic Cardiomyopathy  Non-rheumatic Aortic Valve Insufficiency     Hypertension    Hyperlipidemia       Subjective:  The patient has a history ofcoronary artery disease.  He had four-vessel bypass at age 55 after MI performed at [facility].  LV function was preserved.  In 2013, the patient had a non-ST elevation MI and underwent cardiac catheterization by Dr. Yvonne Kendall at [facility] in [place].  He was found to have an occlusion in the vein graft to the left circumflex, which was borderline stenosis 60% to 70%.  There was 75% stenosis in the mid RCA that had a patent vein graft and there was also patent graft to the diagonal.  Stress test in 2018 for preoperative assessment was low risk study.  EF was 64% on resting imaging.  There was no TID.  There was a moderate size apical perfusion defect that was largely fixed consistent with previous defect just slightly extended.  Repeat stress test in May 2022 was a normal, low risk study, normal myocardial perfusion imaging.  EF was slightly reduced to 46%. Echocardiogram in March 2022 showed EF 45% with moderate aortic insufficiency.    11/03/21 The patient is here due to chest pain.  He states he developed chest pain that woke him up Sunday night.  It started off on the left side but then radiated to the right side and then midsternal.  Symptoms within radiate up through withdrawal similar to reflux.  He used to take Prilosec for reflux but stopped this several years ago.  Sometimes position change will help with symptoms and he was able to get back to sleep, but then symptoms persisted throughout the day the next day.  He ended up going to the emergency room for  evaluation.  His cardiac workup was negative.  His symptoms did improve with a GI cocktail.  He was diagnosed with pneumonia, but chest x-ray showed chronic changes and he had no symptoms related to pneumonia.  He is now on Pepcid and Prilosec, but he continues to have symptoms.    02/14/22 The patient is here for f/u from heart catheterizations, initial cath on 11/23/21 which resulted in PCI to the RCA, staged PCI to the PLB and distal RCA on 12/13/21, and then staged PCI to the SVG to the diagonal as well as to the left main/LCX/OM2.  Chest pain did improve with initial PCI to the RCA.  He states now his shortness of breath is also improving.  He still has some at baseline due to his history of smoking, but he has much more energy now.  He is doing his previous tasks at home that he was unable to do earlier in the spring.  He feels like he is slowly continuing to improve.    08/15/22 The patient is here for CAD follow-up.  He denies any significant chest pains.  Shortness of breath is still improving.  He is able to quit smoking back in December.  He has gained a little weight since then.  He was little confused on his blood pressure medicine, he was not sure if he was taking  lisinopril and amlodipine, but he thinks he is just taking amlodipine.  He states he was switch back and forth and 1 of them was causing leg swelling.    Past Medical History:  Past Medical History:   Diagnosis Date    Anxiety and depression     Atherosclerotic heart disease of native coronary artery without angina pectoris     Chronic back pain     Chronic prostatitis     Urologist Dr Pincus Badder in the past    CKD (chronic kidney disease) stage 3, GFR 30-59 ml/min (CMS HCC)     Coronary disease     Essential hypertension     GERD (gastroesophageal reflux disease)     High vitamin D level     Hyperlipidemia     Renal insufficiency     Tobacco abuse     Vitamin B12 deficiency          Social History:  Social History     Tobacco Use   Smoking Status  Former    Current packs/day: 0.00    Types: Cigarettes    Quit date: 01/23/2022    Years since quitting: 0.5   Smokeless Tobacco Never      Social History     Substance and Sexual Activity   Alcohol Use Yes    Comment: rare social use only      Social History     Substance and Sexual Activity   Drug Use Never      Current Medications:  Current Outpatient Medications   Medication Sig    albuterol sulfate (PROVENTIL OR VENTOLIN OR PROAIR) 90 mcg/actuation Inhalation oral inhaler Take 1-2 Puffs by inhalation Every 6 hours as needed    ALPRAZolam (XANAX) 1 mg Oral Tablet Take 1 Tablet (1 mg total) by mouth Twice per day as needed 1/2 to 1 tab prn    aspirin (ECOTRIN) 81 mg Oral Tablet, Delayed Release (E.C.) Take 1 Tablet (81 mg total) by mouth Once a day    atorvastatin (LIPITOR) 40 mg Oral Tablet TAKE 1 TABLET BY MOUTH ONCE DAILY FOR CHOLESTEROL    citalopram (CELEXA) 20 mg Oral Tablet Take 1 Tablet (20 mg total) by mouth Once a day    clopidogreL (PLAVIX) 75 mg Oral Tablet Take 1 tablet by mouth once daily    cyanocobalamin (VITAMIN B12) 1,000 mcg/mL Injection Solution Inject 1 mL (1,000 mcg total) into the muscle Every 30 days    famotidine (PEPCID) 20 mg Oral Tablet Take 1 Tablet (20 mg total) by mouth Once a day    folic acid 0.8 mg Oral Capsule Take 1 Capsule (0.8 mg total) by mouth Once a day    losartan (COZAAR) 25 mg Oral Tablet Take 1 Tablet (25 mg total) by mouth Once a day    nitroGLYCERIN (NITROSTAT) 0.4 mg Sublingual Tablet, Sublingual Place 1 Tablet (0.4 mg total) under the tongue Every 5 minutes as needed for Chest pain for 3 doses over 15 minutes    omeprazole (PRILOSEC) 20 mg Oral Capsule, Delayed Release(E.C.) Take 1 Capsule (20 mg total) by mouth Once a day    traMADoL (ULTRAM) 50 mg Oral Tablet Take 1 Tablet (50 mg total) by mouth Twice per day as needed for Pain     Allergies:  No Known Allergies   Review of Systems:  Complete ROS was performed and otherwise negative unless noted in HPI.    Vital  Signs:  Vitals:    08/15/22 1029  BP: 129/63   Pulse: 60   SpO2: 96%   Weight: 92.5 kg (204 lb)   Height: 1.829 m (6')   BMI: 27.73      Physical Exam:  General: Pt resting comfortably in no acute distress and appears stated age.    Neck: No JVD, no carotid bruit. Neck supple, symmetrical, trachea midline.   Lungs:  Normal respiratory effort, lungs clear to auscultation bilaterally.    Cardiovascular:  Regular rate and rhythm.  Normal S1 and S2 without murmur, gallop, or rub.  Abdomen: Soft, non-tender and bowel sounds normal.    Extremities: Extremities normal, atraumatic, no cyanosis or edema.    Neurologic: Alert and oriented x3.     Assessment:    Atherosclerotic heart disease of native coronary artery without angina pectoris    Ischemic cardiomyopathy    Tobacco use    Essential hypertension    Hyperlipidemia, unspecified hyperlipidemia type    Nonrheumatic aortic valve insufficiency      Plan:   Stop amlodipine and lisinopril.  Start low-dose losartan given his cardiomyopathy.  Unable to tolerate beta-blocker due to bradycardia.  Continue other medications.  Continue with smoking cessation.  Return in 6 months for routine follow-up.    Orders placed this visit:  Orders Placed This Encounter    EKG (In-Clinic Today)    losartan (COZAAR) 25 mg Oral Tablet       Reyansh D Bechtol [name]. is to return to clinic for follow up with the understanding that should symptoms change or worsen he is to call the office or go to the closest emergency department for evaluation.    Isabell Jarvis, APRN,FNP-BC    A portion of this documentation may have been generated using MMODAL voice recognition software and may contain syntax/voice recognition errors.

## 2022-08-28 ENCOUNTER — Other Ambulatory Visit: Payer: MEDICAID | Attending: Family | Admitting: Family

## 2022-08-28 ENCOUNTER — Encounter (INDEPENDENT_AMBULATORY_CARE_PROVIDER_SITE_OTHER): Payer: Self-pay | Admitting: Family

## 2022-08-28 ENCOUNTER — Ambulatory Visit (INDEPENDENT_AMBULATORY_CARE_PROVIDER_SITE_OTHER): Payer: MEDICAID | Admitting: Family

## 2022-08-28 ENCOUNTER — Other Ambulatory Visit: Payer: Self-pay

## 2022-08-28 VITALS — BP 123/70 | HR 57 | Temp 97.4°F | Ht 72.0 in | Wt 205.8 lb

## 2022-08-28 DIAGNOSIS — K219 Gastro-esophageal reflux disease without esophagitis: Secondary | ICD-10-CM

## 2022-08-28 DIAGNOSIS — I25119 Atherosclerotic heart disease of native coronary artery with unspecified angina pectoris: Secondary | ICD-10-CM

## 2022-08-28 DIAGNOSIS — M545 Low back pain, unspecified: Secondary | ICD-10-CM

## 2022-08-28 DIAGNOSIS — E538 Deficiency of other specified B group vitamins: Secondary | ICD-10-CM

## 2022-08-28 DIAGNOSIS — Z720 Tobacco use: Secondary | ICD-10-CM

## 2022-08-28 DIAGNOSIS — J019 Acute sinusitis, unspecified: Secondary | ICD-10-CM

## 2022-08-28 DIAGNOSIS — I255 Ischemic cardiomyopathy: Secondary | ICD-10-CM

## 2022-08-28 DIAGNOSIS — E673 Hypervitaminosis D: Secondary | ICD-10-CM

## 2022-08-28 DIAGNOSIS — N1831 Chronic kidney disease, stage 3a (CMS HCC): Secondary | ICD-10-CM

## 2022-08-28 DIAGNOSIS — Z951 Presence of aortocoronary bypass graft: Secondary | ICD-10-CM | POA: Insufficient documentation

## 2022-08-28 DIAGNOSIS — M4802 Spinal stenosis, cervical region: Secondary | ICD-10-CM

## 2022-08-28 DIAGNOSIS — G4709 Other insomnia: Secondary | ICD-10-CM

## 2022-08-28 DIAGNOSIS — Z87891 Personal history of nicotine dependence: Secondary | ICD-10-CM

## 2022-08-28 DIAGNOSIS — M549 Dorsalgia, unspecified: Secondary | ICD-10-CM | POA: Insufficient documentation

## 2022-08-28 DIAGNOSIS — Z0130 Encounter for examination of blood pressure without abnormal findings: Secondary | ICD-10-CM

## 2022-08-28 DIAGNOSIS — F17220 Nicotine dependence, chewing tobacco, uncomplicated: Secondary | ICD-10-CM

## 2022-08-28 DIAGNOSIS — G8929 Other chronic pain: Secondary | ICD-10-CM

## 2022-08-28 DIAGNOSIS — F411 Generalized anxiety disorder: Secondary | ICD-10-CM

## 2022-08-28 LAB — CBC WITH DIFF
BASOPHIL #: 0 10*3/uL (ref 0.00–0.10)
BASOPHIL %: 0 % (ref 0–1)
EOSINOPHIL #: 0.2 10*3/uL (ref 0.00–0.50)
EOSINOPHIL %: 3 %
HCT: 42.6 % (ref 36.7–47.1)
HGB: 14.6 g/dL (ref 12.5–16.3)
LYMPHOCYTE #: 1.3 10*3/uL (ref 1.00–3.00)
LYMPHOCYTE %: 21 % (ref 16–44)
MCH: 31.1 pg (ref 23.8–33.4)
MCHC: 34.2 g/dL (ref 32.5–36.3)
MCV: 91 fL (ref 73.0–96.2)
MONOCYTE #: 0.7 10*3/uL (ref 0.30–1.00)
MONOCYTE %: 11 % (ref 5–13)
MPV: 9.1 fL (ref 7.4–11.4)
NEUTROPHIL #: 4 10*3/uL (ref 1.85–7.80)
NEUTROPHIL %: 65 % (ref 43–77)
PLATELETS: 126 10*3/uL — ABNORMAL LOW (ref 140–440)
RBC: 4.68 10*6/uL (ref 4.06–5.63)
RDW: 13.4 % (ref 12.1–16.2)
WBC: 6.1 10*3/uL (ref 3.6–10.2)

## 2022-08-28 LAB — BASIC METABOLIC PANEL
ANION GAP: 5 mmol/L (ref 4–13)
BUN/CREA RATIO: 16 (ref 6–22)
BUN: 24 mg/dL (ref 7–25)
CALCIUM: 8.9 mg/dL (ref 8.6–10.3)
CHLORIDE: 106 mmol/L (ref 98–107)
CO2 TOTAL: 26 mmol/L (ref 21–31)
CREATININE: 1.5 mg/dL — ABNORMAL HIGH (ref 0.60–1.30)
ESTIMATED GFR: 53 mL/min/{1.73_m2} — ABNORMAL LOW (ref >59)
GLUCOSE: 124 mg/dL — ABNORMAL HIGH (ref 74–109)
OSMOLALITY, CALCULATED: 279 mOsm/kg (ref 270–290)
POTASSIUM: 4.3 mmol/L (ref 3.5–5.1)
SODIUM: 137 mmol/L (ref 136–145)

## 2022-08-28 LAB — LIPID PANEL
CHOL/HDL RATIO: 2.7
CHOLESTEROL: 112 mg/dL (ref <200)
HDL CHOL: 41 mg/dL (ref 23–92)
LDL CALC: 61 mg/dL (ref 0–100)
TRIGLYCERIDES: 50 mg/dL (ref <=150)
VLDL CALC: 10 mg/dL (ref 0–50)

## 2022-08-28 MED ORDER — AMOXICILLIN 875 MG-POTASSIUM CLAVULANATE 125 MG TABLET
1.0000 | ORAL_TABLET | Freq: Two times a day (BID) | ORAL | 0 refills | Status: AC
Start: 2022-08-28 — End: 2022-09-02

## 2022-08-28 MED ORDER — TRAZODONE 50 MG TABLET
50.0000 mg | ORAL_TABLET | Freq: Every evening | ORAL | 0 refills | Status: DC | PRN
Start: 2022-08-28 — End: 2022-09-25

## 2022-08-28 MED ORDER — LEVOCETIRIZINE 5 MG TABLET
5.0000 mg | ORAL_TABLET | Freq: Every day | ORAL | 2 refills | Status: DC | PRN
Start: 2022-08-28 — End: 2023-01-05

## 2022-08-28 MED ORDER — PREDNISONE 10 MG TABLET
10.0000 mg | ORAL_TABLET | Freq: Every day | ORAL | 0 refills | Status: AC
Start: 2022-08-28 — End: 2022-09-02
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
## 2022-08-28 NOTE — Patient Instructions (Signed)
Start trazodone nightly as needed for sleep. Call with results.   Xyzal daily as needed for allergy symptoms.   Augmentin and Prednisone for acute sinus symptoms.   Continue other medications.   Complete labs.

## 2022-08-28 NOTE — Progress Notes (Signed)
FAMILY MEDICINE, MEDICAL OFFICE BUILDING  [place] [id number]          Name: Gabriel Best. MRN:  [id number]   Date: 08/28/2022 Age: 62 y.o.          Provider: Benard Rink, FNP-BC    Reason for visit: Follow Up 4 Months and Labs Only      History of Present Illness:  Gabriel Best. is a 62 y.o. male presenting with     Sinus drainage/congestion at night/cough d/t drainage. Exacerbated since yesterday. Disrupted sleep. No fever. Throat sore d/t drainage. Tickle in throat then will cough. No treatment. Has albuterol but has not used/needed.   Med: acute Augmentin and Prednisone.   Add xyzal daily PRN.     Extensive cardiac history, following with cardiologist.   Cardiac cath July 2023. Chronic DOE.   Has had 3 PCI with total of 9 stents.   ECG at cardio Feb 2024.   H/o MI and CABG.   H/o blood donation d/t elevated Hgb.   Meds:   NTG PRN- has not had to use since last stent  Aspirin 81 mg daily  Atorvastatin 40 mg daily   Clopidogrel 75 mg daily  Losartan 25 mg daily  No BB d/t bradycardia.   Previous:   Lisinopril '5mg'$  1/2 tab daily and amlodipine: changed to losartan d/t cardiomyopathy.   Consults: Dr Leonides Schanz     Anxiety/insomnia  Onset: age 43s  March 2024: uses alprazolam twice daily. Wakes around 2-3 am no matter what time he goes to bed. Unable to get back to sleep.   Aug 2023: long-standing use of alprazolam d/t h/o anxiety attacks. He ran out recently during change in PCP and had exacerbation of symptoms. I reviewed risks of withdrawals. BOP consistent, no indication of abuse. Denies SI. No SE of meds. No respiratory depression. He waits at least 1 hour between alprazolam and tramadol when he uses it.   Meds:   Alprazolam 1 mg (1/2 to 1 tab) BID  Citalopram 20 mg daily  Failed melatonin  Trial of trazodone March 2024.   Consults: none      GERD  Mag 2.0 July 2023  Investigations:   No EGD that he is aware of  Colonoscopy Winter 2022  Meds:   Famotidine 20 mg daily  Omeprazole  20 mg daily     Vitamin B12 Def  B12: 716 Aug 2023  Med:  Monthly injections (self administers)   Folate      Elevated vitamin D  Lab: > 120 Aug 2023; 96 Nov 2023  PTH 72.21 Jan 2022  Nov 2023: Denies supplement. Consider BMD if still elevated.      Chronic back pain/DDD/bil knee pain  Hx: cervical fusion, bil knee surgery   March 2024: may use tramadol twice a month, couple of doses each episode. Declines refill needed.   Nov 2023: using tramadol PRN only, uses sparingly. He feels 2 apap may be just as effective. Activity as tolerated.   Med: tramadol PRN only, usually only takes 1 tab a couple of times a week   Was on Lortab previously.      Tobacco Use  March 2024: stopped smoking Dec 2023. Some smokeless tobacco.   Nov 2023: decreased smoking x couple of months, occ dips. Not having a cigarette everyday. Has a dip most days with activity such as walking dog.   Aug 2023: he is motivated to try to quit.  Candy helps. He had gotten down to 4-5 cig daily until he ran out of alprazolam and resumed 1 ppd. Verbalized intent to decrease.   Onset: age 57 Quit x 11 years at age 37 after MI. Restarted age 72 during divorce. Avg 1 PPD.           Historical Data    Past Medical History:  Past Medical History:   Diagnosis Date    Anxiety and depression     Atherosclerotic heart disease of native coronary artery without angina pectoris     Chronic back pain     Chronic prostatitis     Urologist Dr Pincus Badder in the past    CKD (chronic kidney disease) stage 3, GFR 30-59 ml/min (CMS HCC)     Coronary disease     Essential hypertension     GERD (gastroesophageal reflux disease)     High vitamin D level     Hyperlipidemia     Renal insufficiency     Tobacco abuse     Vitamin B12 deficiency          Past Surgical History:  Past Surgical History:   Procedure Laterality Date    CARDIAC CATHETERIZATION  06/19/2012    HX CORONARY ARTERY BYPASS GRAFT      HX KNEE SURGERY Bilateral     Dr Marcelino Scot    HX LAP CHOLECYSTECTOMY      NECK SURGERY       Vertebraes fused back together         Allergies:  No Known Allergies  Medications:  Current Outpatient Medications   Medication Sig    albuterol sulfate (PROVENTIL OR VENTOLIN OR PROAIR) 90 mcg/actuation Inhalation oral inhaler Take 1-2 Puffs by inhalation Every 6 hours as needed    ALPRAZolam (XANAX) 1 mg Oral Tablet Take 1 Tablet (1 mg total) by mouth Twice per day as needed 1/2 to 1 tab prn    amoxicillin-pot clavulanate (AUGMENTIN) 875-125 mg Oral Tablet Take 1 Tablet by mouth Twice daily for 5 days    aspirin (ECOTRIN) 81 mg Oral Tablet, Delayed Release (E.C.) Take 1 Tablet (81 mg total) by mouth Once a day    atorvastatin (LIPITOR) 40 mg Oral Tablet TAKE 1 TABLET BY MOUTH ONCE DAILY FOR CHOLESTEROL    citalopram (CELEXA) 20 mg Oral Tablet Take 1 Tablet (20 mg total) by mouth Once a day    clopidogreL (PLAVIX) 75 mg Oral Tablet Take 1 tablet by mouth once daily    cyanocobalamin (VITAMIN B12) 1,000 mcg/mL Injection Solution Inject 1 mL (1,000 mcg total) into the muscle Every 30 days    famotidine (PEPCID) 20 mg Oral Tablet Take 1 Tablet (20 mg total) by mouth Once a day    folic acid 0.8 mg Oral Capsule Take 1 Capsule (0.8 mg total) by mouth Once a day    Levocetirizine (XYZAL) 5 mg Oral Tablet Take 1 Tablet (5 mg total) by mouth Once per day as needed for Allergies    losartan (COZAAR) 25 mg Oral Tablet Take 1 Tablet (25 mg total) by mouth Once a day    nitroGLYCERIN (NITROSTAT) 0.4 mg Sublingual Tablet, Sublingual Place 1 Tablet (0.4 mg total) under the tongue Every 5 minutes as needed for Chest pain for 3 doses over 15 minutes    omeprazole (PRILOSEC) 20 mg Oral Capsule, Delayed Release(E.C.) Take 1 Capsule (20 mg total) by mouth Once a day    predniSONE (DELTASONE) 10 mg Oral Tablet Take 1 Tablet (  10 mg total) by mouth Once a day for 5 days    traMADoL (ULTRAM) 50 mg Oral Tablet Take 1 Tablet (50 mg total) by mouth Twice per day as needed for Pain    traZODone (DESYREL) 50 mg Oral Tablet Take 1 Tablet  (50 mg total) by mouth Every night as needed for Insomnia     Family History:  Family Medical History:       Problem Relation (Age of Onset)    Cancer Mother    Coronary Artery Disease Father            Social History:  Social History     Socioeconomic History    Marital status: Divorced   Occupational History    Occupation: disabled d/t back   Tobacco Use    Smoking status: Former     Current packs/day: 0.00     Types: Cigarettes     Quit date: 01/23/2022     Years since quitting: 0.5    Smokeless tobacco: Current   Vaping Use    Vaping status: Never Used   Substance and Sexual Activity    Alcohol use: Yes     Comment: rare social use only    Drug use: Never          Physical Exam:  Vital Signs:  Vitals:    08/28/22 0833   BP: 123/70   Pulse: 57   Temp: 36.3 C (97.4 F)   TempSrc: Temporal   SpO2: 95%   Weight: 93.4 kg (205 lb 12.8 oz)   Height: 1.829 m (6')   BMI: 27.97     Physical Exam  Vitals and nursing note reviewed.   Constitutional:       General: He is not in acute distress.     Appearance: Normal appearance. He is normal weight. He is not ill-appearing.   HENT:      Head: Normocephalic.      Right Ear: Tympanic membrane, ear canal and external ear normal.      Left Ear: Tympanic membrane, ear canal and external ear normal.      Nose: Congestion and rhinorrhea present. Rhinorrhea is clear.      Right Turbinates: Enlarged and swollen.      Left Turbinates: Enlarged and swollen.      Mouth/Throat:      Pharynx: Posterior oropharyngeal erythema present. No pharyngeal swelling.      Comments: PND  Eyes:      General: Lids are normal. No scleral icterus.  Neck:      Vascular: No carotid bruit.   Cardiovascular:      Rate and Rhythm: Normal rate and regular rhythm.      Pulses: Normal pulses.      Heart sounds: Normal heart sounds. No murmur heard.  Pulmonary:      Effort: Pulmonary effort is normal. No accessory muscle usage or respiratory distress.      Breath sounds: Normal breath sounds. No wheezing, rhonchi  or rales.   Abdominal:      General: Bowel sounds are normal.      Palpations: Abdomen is soft.      Tenderness: There is no abdominal tenderness. There is no guarding.   Musculoskeletal:      Right lower leg: No edema.      Left lower leg: No edema.   Lymphadenopathy:      Cervical: No cervical adenopathy.   Skin:     General: Skin is warm and dry.  Coloration: Skin is not jaundiced.   Neurological:      General: No focal deficit present.      Mental Status: He is alert.   Psychiatric:         Attention and Perception: Attention normal.         Mood and Affect: Mood normal.         Speech: Speech normal.         Behavior: Behavior normal. Behavior is cooperative.          Assessment:    ICD-10-CM    1. Coronary artery disease involving native coronary artery of native heart with angina pectoris (CMS HCC)  123XX123 BASIC METABOLIC PANEL     CBC/DIFF     LIPID PANEL      2. Hx of CABG  Z95.1 CBC/DIFF     LIPID PANEL      3. Stage 3a chronic kidney disease (CMS HCC)  123XX123 BASIC METABOLIC PANEL     CBC/DIFF      4. Ischemic cardiomyopathy  I25.5       5. Acute sinusitis, recurrence not specified, unspecified location  J01.90       6. GAD (generalized anxiety disorder)  F41.1       7. Other insomnia  G47.09       8. Gastroesophageal reflux disease, unspecified whether esophagitis present  K21.9       9. Vitamin B12 deficiency  E53.8       10. High vitamin D level  E67.3       11. Spinal stenosis in cervical region  M48.02       12. Chronic low back pain, unspecified back pain laterality, unspecified whether sciatica present  M54.50     G89.29       13. Tobacco abuse  Z72.0            Plan:  Orders Placed This Encounter    BASIC METABOLIC PANEL    CBC/DIFF    LIPID PANEL    CBC WITH DIFF    amoxicillin-pot clavulanate (AUGMENTIN) 875-125 mg Oral Tablet    Levocetirizine (XYZAL) 5 mg Oral Tablet    predniSONE (DELTASONE) 10 mg Oral Tablet    traZODone (DESYREL) 50 mg Oral Tablet       Chronic medical conditions  reviewed and updated per HPI.    Reviewed previous labs and additional ordered.   CAD/history of CABG/ischemic cardiomyopathy: No acute symptoms.  Denies chest pain.  Reports has not had chest pain since the last stent.    CKD:  No acute symptoms.  Lab has been stable.    Acute sinusitis: Acute treatment as directed.    GAD: Symptoms are well-controlled with current medication, continue.    Insomnia: Previously failed melatonin.  Trial of trazodone and reassess.    GERD:  Symptoms are well controlled with current medications.    Vitamin B12 deficiency:  Well-controlled with current supplement.    History of high vitamin-D: Repeat level had improved.    Spinal stenosis/chronic low back pain: Symptoms are stable with current therapy.  Uses tramadol infrequently.    Tobacco use:  Stopped smoking in December but continues to use smokeless tobacco.  Cessation encouraged.  Mild weight gain due to increase use of candy.     Time: 35    Return in about 4 months (around 12/28/2022) for cdm.    Benard Rink, FNP-BC     Portions of this note may be dictated  using voice recognition software or a dictation service. Variances in spelling and vocabulary are possible and unintentional. Not all errors are caught/corrected. Please notify the [facility] if any discrepancies are noted or if the meaning of any statement is not clear.

## 2022-09-20 ENCOUNTER — Telehealth (INDEPENDENT_AMBULATORY_CARE_PROVIDER_SITE_OTHER): Payer: Self-pay | Admitting: Family

## 2022-09-20 NOTE — Nursing Note (Signed)
Patient had left a message (note was on my desk) while we were both off that he is needing something to help him sleep, he said the other pills just aren't working. (He is aware Estill Bamberg is out until Monday)

## 2022-09-23 ENCOUNTER — Other Ambulatory Visit (INDEPENDENT_AMBULATORY_CARE_PROVIDER_SITE_OTHER): Payer: Self-pay | Admitting: Family

## 2022-09-25 ENCOUNTER — Other Ambulatory Visit (INDEPENDENT_AMBULATORY_CARE_PROVIDER_SITE_OTHER): Payer: Self-pay | Admitting: Family

## 2022-09-25 MED ORDER — TRAZODONE 100 MG TABLET
100.0000 mg | ORAL_TABLET | Freq: Every evening | ORAL | 0 refills | Status: DC
Start: 2022-09-25 — End: 2023-01-05

## 2022-09-26 NOTE — Telephone Encounter (Signed)
Patient informed. Voiced understanding. Stated he will try this first before a referral.

## 2022-09-27 LAB — ECG W INTERP (AMB USE ONLY)(MUSE,IN CLINIC)
Atrial Rate: 55 {beats}/min
Calculated P Axis: 64 degrees
Calculated R Axis: 11 degrees
Calculated T Axis: 67 degrees
PR Interval: 154 ms
QRS Duration: 98 ms
QT Interval: 460 ms
QTC Calculation: 440 ms
Ventricular rate: 55 {beats}/min

## 2022-10-06 ENCOUNTER — Other Ambulatory Visit (INDEPENDENT_AMBULATORY_CARE_PROVIDER_SITE_OTHER): Payer: Self-pay | Admitting: Family

## 2022-10-08 MED ORDER — ALPRAZOLAM 1 MG TABLET
1.0000 mg | ORAL_TABLET | Freq: Two times a day (BID) | ORAL | 2 refills | Status: DC | PRN
Start: 2022-10-08 — End: 2023-01-05

## 2022-10-17 ENCOUNTER — Encounter (INDEPENDENT_AMBULATORY_CARE_PROVIDER_SITE_OTHER): Payer: Self-pay | Admitting: INTERVENTIONAL CARDIOLOGY

## 2022-11-03 ENCOUNTER — Encounter (INDEPENDENT_AMBULATORY_CARE_PROVIDER_SITE_OTHER): Payer: Self-pay

## 2022-11-03 ENCOUNTER — Ambulatory Visit (INDEPENDENT_AMBULATORY_CARE_PROVIDER_SITE_OTHER): Payer: MEDICAID

## 2022-11-03 ENCOUNTER — Other Ambulatory Visit: Payer: Self-pay

## 2022-11-03 ENCOUNTER — Other Ambulatory Visit: Payer: MEDICAID

## 2022-11-03 VITALS — BP 123/70 | HR 68 | Temp 98.8°F | Resp 18 | Ht 72.0 in | Wt 201.8 lb

## 2022-11-03 DIAGNOSIS — R109 Unspecified abdominal pain: Secondary | ICD-10-CM | POA: Insufficient documentation

## 2022-11-03 DIAGNOSIS — N411 Chronic prostatitis: Secondary | ICD-10-CM

## 2022-11-03 LAB — URINALYSIS, MACROSCOPIC
BILIRUBIN: NEGATIVE mg/dL
BLOOD: NEGATIVE mg/dL
GLUCOSE: NEGATIVE mg/dL
KETONES: NEGATIVE mg/dL
LEUKOCYTES: NEGATIVE WBCs/uL
NITRITE: NEGATIVE
PH: 5.5 (ref 5.0–9.0)
PROTEIN: NEGATIVE mg/dL
SPECIFIC GRAVITY: 1.012 (ref 1.002–1.030)
UROBILINOGEN: 2 mg/dL — AB

## 2022-11-03 LAB — BASIC METABOLIC PANEL
ANION GAP: 10 mmol/L (ref 4–13)
BUN/CREA RATIO: 9 (ref 6–22)
BUN: 15 mg/dL (ref 7–25)
CALCIUM: 8.9 mg/dL (ref 8.6–10.3)
CHLORIDE: 104 mmol/L (ref 98–107)
CO2 TOTAL: 26 mmol/L (ref 21–31)
CREATININE: 1.62 mg/dL — ABNORMAL HIGH (ref 0.60–1.30)
ESTIMATED GFR: 48 mL/min/{1.73_m2} — ABNORMAL LOW (ref >59)
GLUCOSE: 127 mg/dL — ABNORMAL HIGH (ref 74–109)
OSMOLALITY, CALCULATED: 282 mOsm/kg (ref 270–290)
POTASSIUM: 3.8 mmol/L (ref 3.5–5.1)
SODIUM: 140 mmol/L (ref 136–145)

## 2022-11-03 LAB — URINALYSIS, MICROSCOPIC
RBCS: 1 /hpf (ref <4)
WBCS: 2 /hpf (ref <6)

## 2022-11-03 MED ORDER — SULFAMETHOXAZOLE 800 MG-TRIMETHOPRIM 160 MG TABLET
1.0000 | ORAL_TABLET | Freq: Two times a day (BID) | ORAL | 0 refills | Status: DC
Start: 2022-11-03 — End: 2022-11-06
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
## 2022-11-03 NOTE — Progress Notes (Signed)
FAMILY MEDICINE, MEDICAL OFFICE BUILDING  [address]  [place] [address]      Law Zara.  January 20, 1961  [id number]    Date of Service: 11/03/2022  1:45 PM EDT    Chief complaint:   Chief Complaint   Patient presents with    Flank Pain    Groin Pain     Subjective:     This is a case of a 62 y.o. male who comes in today for complaint(s) of: Flank and Groin pain. Patient endorses a pain in lower back bilaterally that radiates to bilateral flank and testicles. Patient describes his pain as a dull pressure that occasionally feels as if someone has his his back and testicles in a tight grip. Patient denies dysuria or alteration to urinary frequency. He informs me that he has chronic prostatitis and that his symptoms feel consistent with an acute flare-up. Patient endorses previous effective treatment with Bactrim through Urology, but that his urologist retired years ago and he hasn't been seen by one since.     ROS:  Review of Systems   Constitutional: Negative.    HENT: Negative.     Eyes: Negative.    Respiratory: Negative.     Cardiovascular: Negative.    Gastrointestinal: Negative.    Endocrine: Negative.    Genitourinary:  Positive for flank pain and testicular pain. Negative for decreased urine volume, difficulty urinating, dysuria, frequency, hematuria, penile discharge, penile pain, penile swelling, scrotal swelling and urgency.   Musculoskeletal:  Positive for back pain.   Skin: Negative.    Allergic/Immunologic: Negative.    Neurological: Negative.    Hematological: Negative.    Psychiatric/Behavioral: Negative.       History:  Active Ambulatory Problems     Diagnosis Date Noted    Coronary artery disease involving native coronary artery of native heart with angina pectoris (CMS [facility]) 12/13/2021    Atypical angina (CMS HCC) 12/13/2021    Ischemic cardiomyopathy 12/13/2021    Chest pain 01/19/2022    Essential (primary) hypertension 01/24/2022    Spinal stenosis in cervical region 01/24/2022     Hx of CABG 01/24/2022    GERD (gastroesophageal reflux disease) 01/24/2022    Vitamin B12 deficiency 01/24/2022    Tobacco abuse 01/24/2022    Renal insufficiency 01/31/2022    High vitamin D level 01/31/2022    CKD (chronic kidney disease) stage 3, GFR 30-59 ml/min (CMS HCC) 01/31/2022    GAD (generalized anxiety disorder) 1993    Other insomnia     Chronic back pain 08/28/2022    Chronic prostatitis 11/03/2022     Resolved Ambulatory Problems     Diagnosis Date Noted    No Resolved Ambulatory Problems     Past Medical History:   Diagnosis Date    Anxiety and depression     Atherosclerotic heart disease of native coronary artery without angina pectoris     Coronary disease     Essential hypertension     Hyperlipidemia      Past Surgical History:   Procedure Laterality Date    CARDIAC CATHETERIZATION  06/19/2012    HX CORONARY ARTERY BYPASS GRAFT      HX KNEE SURGERY Bilateral     Dr Lindwood Qua    HX LAP CHOLECYSTECTOMY      NECK SURGERY      Vertebraes fused back together     Current Outpatient Medications   Medication Sig    albuterol sulfate (PROVENTIL OR VENTOLIN OR  PROAIR) 90 mcg/actuation Inhalation oral inhaler Take 1-2 Puffs by inhalation Every 6 hours as needed    ALPRAZolam (XANAX) 1 mg Oral Tablet Take 1 Tablet (1 mg total) by mouth Twice per day as needed 1/2 to 1 tab prn    aspirin (ECOTRIN) 81 mg Oral Tablet, Delayed Release (E.C.) Take 1 Tablet (81 mg total) by mouth Once a day    atorvastatin (LIPITOR) 40 mg Oral Tablet TAKE 1 TABLET BY MOUTH ONCE DAILY FOR CHOLESTEROL    citalopram (CELEXA) 20 mg Oral Tablet Take 1 tablet by mouth once daily    clopidogreL (PLAVIX) 75 mg Oral Tablet Take 1 tablet by mouth once daily    cyanocobalamin (VITAMIN B12) 1,000 mcg/mL Injection Solution INJECT 1 ML (CC) INTRAMUSCULARLY ONCE EVERY MONTH (EVERY 30 DAYS)    famotidine (PEPCID) 20 mg Oral Tablet Take 1 Tablet (20 mg total) by mouth Once a day    folic acid 0.8 mg Oral Capsule Take 1 Capsule (0.8 mg total) by mouth  Once a day    Levocetirizine (XYZAL) 5 mg Oral Tablet Take 1 Tablet (5 mg total) by mouth Once per day as needed for Allergies    losartan (COZAAR) 25 mg Oral Tablet Take 1 Tablet (25 mg total) by mouth Once a day    nitroGLYCERIN (NITROSTAT) 0.4 mg Sublingual Tablet, Sublingual Place 1 Tablet (0.4 mg total) under the tongue Every 5 minutes as needed for Chest pain for 3 doses over 15 minutes    omeprazole (PRILOSEC) 20 mg Oral Capsule, Delayed Release(E.C.) Take 1 Capsule (20 mg total) by mouth Once a day    traMADoL (ULTRAM) 50 mg Oral Tablet Take 1 Tablet (50 mg total) by mouth Twice per day as needed for Pain    traZODone (DESYREL) 100 mg Oral Tablet Take 1 Tablet (100 mg total) by mouth Every night    trimethoprim-sulfamethoxazole (BACTRIM DS) 160-800mg  per tablet Take 1 Tablet (160 mg total) by mouth Twice daily for 45 days     Objective   BP 123/70   Pulse 68   Temp 37.1 C (98.8 F) (Temporal)   Resp 18   Ht 1.829 m (6')   Wt 91.5 kg (201 lb 12.8 oz)   SpO2 96%   BMI 27.37 kg/m     BMI addressed: Advised on diet, weight loss, and exercise to reduce above normal BMI.     Physical Exam  Vitals and nursing note reviewed.   Constitutional:       Appearance: Normal appearance. He is normal weight.   HENT:      Nose: Nose normal.      Mouth/Throat:      Mouth: Mucous membranes are moist.   Eyes:      Pupils: Pupils are equal, round, and reactive to light.   Cardiovascular:      Rate and Rhythm: Normal rate and regular rhythm.      Pulses: Normal pulses.      Heart sounds: Normal heart sounds, S1 normal and S2 normal.   Pulmonary:      Effort: Pulmonary effort is normal.      Breath sounds: Normal breath sounds.   Abdominal:      General: Abdomen is flat. Bowel sounds are normal.      Palpations: Abdomen is soft.   Musculoskeletal:         General: No swelling or tenderness.      Right lower leg: No edema.      Left  lower leg: No edema.   Skin:     General: Skin is warm and dry.      Capillary Refill:  Capillary refill takes less than 2 seconds.   Neurological:      General: No focal deficit present.      Mental Status: He is alert and oriented to person, place, and time. Mental status is at baseline.   Psychiatric:         Mood and Affect: Mood normal.         Behavior: Behavior is cooperative.        Data Reviewed:  Recent lab results reviewed and noted.  COMPLETE BLOOD COUNT   Lab Results   Component Value Date    WBC 6.1 08/28/2022    HGB 14.6 08/28/2022    HCT 42.6 08/28/2022    PLTCNT 126 (L) 08/28/2022     DIFFERENTIAL  Lab Results   Component Value Date    PMNS 65 08/28/2022    LYMPHOCYTES 21 08/28/2022    MONOCYTES 11 08/28/2022    EOSINOPHIL 3 08/28/2022    BASOPHILS 0 08/28/2022    BASOPHILS 0.00 08/28/2022    PMNABS 4.00 08/28/2022    LYMPHSABS 1.30 08/28/2022    EOSABS 0.20 08/28/2022    MONOSABS 0.70 08/28/2022     BASIC METABOLIC PANEL  Lab Results   Component Value Date    SODIUM 137 08/28/2022    POTASSIUM 4.3 08/28/2022    CHLORIDE 106 08/28/2022    CO2 26 08/28/2022    ANIONGAP 5 08/28/2022    BUN 24 08/28/2022    CREATININE 1.50 (H) 08/28/2022    BUNCRRATIO 16 08/28/2022    GFR 53 (L) 08/28/2022    CALCIUM 8.9 08/28/2022    GLUCOSENF 124 (H) 08/28/2022      LIPID PROFILE  Lab Results   Component Value Date    CHOLESTEROL 112 08/28/2022    HDLCHOL 41 08/28/2022    LDLCHOL 61 08/28/2022    TRIG 50 08/28/2022     THYROID STIMULATING HORMONE  Lab Results   Component Value Date    TSH 1.237 01/31/2022     VITAMIN B12  Lab Results   Component Value Date    VITB12 761 01/31/2022     VITAMIN D 25-HYDROXY  Lab Results   Component Value Date    VITD 96 04/27/2022     Assessment/Plan       ICD-10-CM    1. Flank pain, acute  R10.9 BASIC METABOLIC PANEL     URINALYSIS, MACROSCOPIC AND MICROSCOPIC W/CULTURE REFLEX      2. Chronic prostatitis  N41.1 Referral to UROLOGY - [place] [facility]     BASIC METABOLIC PANEL     URINALYSIS, MACROSCOPIC AND MICROSCOPIC W/CULTURE REFLEX        Orders Placed This  Encounter    BASIC METABOLIC PANEL    URINALYSIS, MACROSCOPIC AND MICROSCOPIC W/CULTURE REFLEX    Referral to UROLOGY - [place] - [facility]    trimethoprim-sulfamethoxazole (BACTRIM DS) 160-800mg  per tablet     I discussed the presumptive etiology as well as available treatment options. I placed an order for a urinalysis with culture/reflex. I referred the patient back to Urology for his chronic prostatitis since his previous specialist retired. I ordered Bactrim with the patient's endorsement that other antibiotics were used first and were ineffective, but that Bactrim has proven to work for him in the past. I also ordered a BMP primarily to observe the patient's baseline  potassium level due to risks of hyperkalemia with Losartan and Bactrim.    The patient was given the opportunity to ask questions and those questions were answered to the patient's satisfaction. The patient was encouraged to call with any additional questions or concerns. I instructed the patient to call back if symptoms persist or worsen. Discussed with patient, the effects and side effects of medications. Medication safety was discussed. A copy of the patient's medication list was printed and given to the patient. A good faith effort was made to reconcile the patient's medications.     Follow up: Return if symptoms worsen or fail to improve.  Ignacia Marvel, APRN,NP-C

## 2022-11-06 ENCOUNTER — Other Ambulatory Visit (INDEPENDENT_AMBULATORY_CARE_PROVIDER_SITE_OTHER): Payer: Self-pay

## 2022-11-06 MED ORDER — BACLOFEN 5 MG TABLET
5.0000 mg | ORAL_TABLET | Freq: Three times a day (TID) | ORAL | 0 refills | Status: AC
Start: 2022-11-06 — End: 2022-11-16

## 2022-12-29 ENCOUNTER — Encounter (INDEPENDENT_AMBULATORY_CARE_PROVIDER_SITE_OTHER): Payer: Self-pay | Admitting: Family

## 2023-01-05 ENCOUNTER — Ambulatory Visit (INDEPENDENT_AMBULATORY_CARE_PROVIDER_SITE_OTHER): Payer: MEDICAID | Admitting: Family

## 2023-01-05 ENCOUNTER — Encounter (INDEPENDENT_AMBULATORY_CARE_PROVIDER_SITE_OTHER): Payer: Self-pay | Admitting: Family

## 2023-01-05 ENCOUNTER — Other Ambulatory Visit: Payer: Self-pay

## 2023-01-05 VITALS — BP 122/67 | HR 60 | Temp 97.6°F | Ht 72.0 in | Wt 204.0 lb

## 2023-01-05 DIAGNOSIS — J019 Acute sinusitis, unspecified: Secondary | ICD-10-CM

## 2023-01-05 DIAGNOSIS — K219 Gastro-esophageal reflux disease without esophagitis: Secondary | ICD-10-CM

## 2023-01-05 DIAGNOSIS — E673 Hypervitaminosis D: Secondary | ICD-10-CM

## 2023-01-05 DIAGNOSIS — G8929 Other chronic pain: Secondary | ICD-10-CM

## 2023-01-05 DIAGNOSIS — I25119 Atherosclerotic heart disease of native coronary artery with unspecified angina pectoris: Secondary | ICD-10-CM

## 2023-01-05 DIAGNOSIS — I10 Essential (primary) hypertension: Secondary | ICD-10-CM

## 2023-01-05 DIAGNOSIS — G4709 Other insomnia: Secondary | ICD-10-CM

## 2023-01-05 DIAGNOSIS — N411 Chronic prostatitis: Secondary | ICD-10-CM

## 2023-01-05 DIAGNOSIS — Z955 Presence of coronary angioplasty implant and graft: Secondary | ICD-10-CM

## 2023-01-05 DIAGNOSIS — Z951 Presence of aortocoronary bypass graft: Secondary | ICD-10-CM

## 2023-01-05 DIAGNOSIS — F411 Generalized anxiety disorder: Secondary | ICD-10-CM

## 2023-01-05 DIAGNOSIS — E538 Deficiency of other specified B group vitamins: Secondary | ICD-10-CM

## 2023-01-05 MED ORDER — PREDNISONE 10 MG TABLET
10.0000 mg | ORAL_TABLET | Freq: Every day | ORAL | 0 refills | Status: AC
Start: 2023-01-05 — End: 2023-01-12

## 2023-01-05 MED ORDER — PROMETHAZINE-DM 6.25 MG-15 MG/5 ML ORAL SYRUP
5.0000 mL | ORAL_SOLUTION | Freq: Four times a day (QID) | ORAL | 0 refills | Status: AC | PRN
Start: 2023-01-05 — End: 2023-01-11

## 2023-01-05 MED ORDER — LEVOCETIRIZINE 5 MG TABLET
5.0000 mg | ORAL_TABLET | Freq: Every day | ORAL | 2 refills | Status: DC | PRN
Start: 2023-01-05 — End: 2023-08-16

## 2023-01-05 MED ORDER — ALPRAZOLAM 1 MG TABLET
1.0000 mg | ORAL_TABLET | Freq: Two times a day (BID) | ORAL | 3 refills | Status: DC | PRN
Start: 2023-01-05 — End: 2023-05-01

## 2023-01-05 MED ORDER — DOXYCYCLINE HYCLATE 100 MG CAPSULE
100.0000 mg | ORAL_CAPSULE | Freq: Two times a day (BID) | ORAL | 0 refills | Status: AC
Start: 2023-01-05 — End: 2023-01-12

## 2023-01-05 MED ORDER — AMITRIPTYLINE 10 MG TABLET
10.0000 mg | ORAL_TABLET | Freq: Every evening | ORAL | 1 refills | Status: DC
Start: 2023-01-05 — End: 2023-05-01

## 2023-01-05 NOTE — Nursing Note (Signed)
01/05/23 1357   Depression Screen   Little interest or pleasure in doing things. 0   Feeling down, depressed, or hopeless 0   PHQ 2 Total 0

## 2023-01-05 NOTE — Progress Notes (Unsigned)
FAMILY MEDICINE, MEDICAL OFFICE BUILDING  [address]  [place] [address]          Name: Gabriel Best. MRN:  [id number]   Date: 01/05/2023 Age: 62 y.o.          Provider: Elliot Gurney, FNP-BC    Reason for visit: Follow Up 4 Months      History of Present Illness:  Gabriel D Bennison Montez Hageman. is a 62 y.o. male presenting with recurrent head/sinus congestion. Denies fever. Exacerbated with weather change. Previously failed multiple nasal sprays including Flonase and Nasonex. No significant change with Xyzal, no recent use. Some ongoing cough. CXR Nov 2023.   Declines LDCT.     No acute urinary symptoms. Has appt with urology Aug 2024.     Extensive cardiac history, following with cardiologist.   Cardiac cath July 2023. Chronic DOE.   Has had 3 PCI with total of 9 stents.   ECG at cardio Feb 2024.   H/o MI and CABG.   H/o blood donation d/t elevated Hgb.   Meds:   NTG PRN- has not had to use since last stent  Aspirin 81 mg daily  Atorvastatin 40 mg daily   Clopidogrel 75 mg daily  Losartan 25 mg daily  No BB d/t bradycardia.   Previous:   Lisinopril 5mg  1/2 tab daily and amlodipine: changed to losartan d/t cardiomyopathy.   Consults: Dr Elesa Massed     Anxiety/insomnia  Onset: age 26s  July 2024: continues to use 1 tab alprazolam twice daily. Initial insomnia.   March 2024: uses alprazolam twice daily. Wakes around 2-3 am no matter what time he goes to bed. Unable to get back to sleep.   Aug 2023: long-standing use of alprazolam d/t h/o anxiety attacks. He ran out recently during change in PCP and had exacerbation of symptoms. I reviewed risks of withdrawals. BOP consistent, no indication of abuse. Denies SI. No SE of meds. No respiratory depression. He waits at least 1 hour between alprazolam and tramadol when he uses it.   Meds:   Alprazolam 1 mg BID  Citalopram 20 mg daily  Failed melatonin  Failed trazodone March 2024 (ineffective)   Trial of amitriptyline July 2024  Consults: none      GERD  Mag 2.0 July  2023  Investigations:   No EGD that he is aware of  Colonoscopy Winter 2022  Meds:   Famotidine 20 mg daily  Omeprazole 20 mg daily     Vitamin B12 Def  B12: 716 Aug 2023  Med:  Monthly injections (self administers)   Folate      Elevated vitamin D  Lab: > 120 Aug 2023; 96 Nov 2023  PTH 72.21 Jan 2022  Nov 2023: Denies supplement. Consider BMD if still elevated.      Chronic back pain/DDD/bil knee pain  Hx: cervical fusion, bil knee surgery   March 2024: may use tramadol twice a month, couple of doses each episode. Declines refill needed.   Nov 2023: using tramadol PRN only, uses sparingly. He feels 2 apap may be just as effective. Activity as tolerated.   Med: tramadol PRN only  Was on Lortab previously.      Tobacco Use  March 2024: stopped smoking Dec 2023. Some smokeless tobacco.   Nov 2023: decreased smoking x couple of months, occ dips. Not having a cigarette everyday. Has a dip most days with activity such as walking dog.   Aug 2023: he is motivated to  try to quit. Candy helps. He had gotten down to 4-5 cig daily until he ran out of alprazolam and resumed 1 ppd. Verbalized intent to decrease.   Onset: age 25 Quit x 11 years at age 55 after MI. Restarted age 49 during divorce. Avg 1 PPD. Quit Dec 2023. Approx 34 pack year history.      A1C: 5.4  A1C Date: 01/31/2022  COMPLETE BLOOD COUNT   Lab Results   Component Value Date    WBC 6.1 08/28/2022    HGB 14.6 08/28/2022    HCT 42.6 08/28/2022    PLTCNT 126 (L) 08/28/2022       DIFFERENTIAL  Lab Results   Component Value Date    PMNS 65 08/28/2022    LYMPHOCYTES 21 08/28/2022    MONOCYTES 11 08/28/2022    EOSINOPHIL 3 08/28/2022    BASOPHILS 0 08/28/2022    BASOPHILS 0.00 08/28/2022    PMNABS 4.00 08/28/2022    LYMPHSABS 1.30 08/28/2022    EOSABS 0.20 08/28/2022    MONOSABS 0.70 08/28/2022     BASIC METABOLIC PANEL  Lab Results   Component Value Date    SODIUM 140 11/03/2022    POTASSIUM 3.8 11/03/2022    CHLORIDE 104 11/03/2022    CO2 26 11/03/2022    ANIONGAP 10  11/03/2022    BUN 15 11/03/2022    CREATININE 1.62 (H) 11/03/2022    BUNCRRATIO 9 11/03/2022    GFR 48 (L) 11/03/2022    CALCIUM 8.9 11/03/2022    GLUCOSE Negative 11/03/2022    GLUCOSENF 127 (H) 11/03/2022      LIPID PROFILE  Lab Results   Component Value Date    CHOLESTEROL 112 08/28/2022    HDLCHOL 41 08/28/2022    LDLCHOL 61 08/28/2022    TRIG 50 08/28/2022       LIVER TESTS  Lab Results   Component Value Date    ALBUMIN 3.9 04/27/2022    AST 15 04/27/2022    ALT 8 04/27/2022    ALKPHOS 96 04/27/2022    TOTBILIRUBIN 0.8 04/27/2022    BILIRUBINCON 0.19 04/27/2022    INR 1.03 01/10/2022     PROSTATE SPECIFIC ANTIGEN  Lab Results   Component Value Date    PROSSPECAG 0.28 01/31/2022       THYROID STIMULATING HORMONE  Lab Results   Component Value Date    TSH 1.237 01/31/2022     VITAMIN B12  Lab Results   Component Value Date    VITB12 761 01/31/2022     VITAMIN D 25-HYDROXY  Lab Results   Component Value Date    VITD 96 04/27/2022        Historical Data    Past Medical History:  Past Medical History:   Diagnosis Date    Anxiety and depression     Atherosclerotic heart disease of native coronary artery without angina pectoris     Chronic back pain     Chronic prostatitis     Urologist Dr Corwin Levins in the past    CKD (chronic kidney disease) stage 3, GFR 30-59 ml/min (CMS HCC)     Coronary disease     Essential hypertension     GERD (gastroesophageal reflux disease)     High vitamin D level     Hyperlipidemia     Renal insufficiency     Tobacco abuse     Vitamin B12 deficiency          Past Surgical History:  Past  Surgical History:   Procedure Laterality Date    CARDIAC CATHETERIZATION  06/19/2012    HX CORONARY ARTERY BYPASS GRAFT      HX KNEE SURGERY Bilateral     Dr Lindwood Qua    HX LAP CHOLECYSTECTOMY      NECK SURGERY      Vertebraes fused back together         Allergies:  No Known Allergies  Medications:  Current Outpatient Medications   Medication Sig    albuterol sulfate (PROVENTIL OR VENTOLIN OR PROAIR) 90  mcg/actuation Inhalation oral inhaler Take 1-2 Puffs by inhalation Every 6 hours as needed    ALPRAZolam (XANAX) 1 mg Oral Tablet Take 1 Tablet (1 mg total) by mouth Twice per day as needed    amitriptyline (ELAVIL) 10 mg Oral Tablet Take 1 Tablet (10 mg total) by mouth Every night    aspirin (ECOTRIN) 81 mg Oral Tablet, Delayed Release (E.C.) Take 1 Tablet (81 mg total) by mouth Once a day    atorvastatin (LIPITOR) 40 mg Oral Tablet TAKE 1 TABLET BY MOUTH ONCE DAILY FOR CHOLESTEROL    citalopram (CELEXA) 20 mg Oral Tablet Take 1 tablet by mouth once daily    clopidogreL (PLAVIX) 75 mg Oral Tablet Take 1 tablet by mouth once daily    cyanocobalamin (VITAMIN B12) 1,000 mcg/mL Injection Solution INJECT 1 ML (CC) INTRAMUSCULARLY ONCE EVERY MONTH (EVERY 30 DAYS)    doxycycline hyclate (VIBRAMYCIN) 100 mg Oral Capsule Take 1 Capsule (100 mg total) by mouth Twice daily for 7 days    famotidine (PEPCID) 20 mg Oral Tablet Take 1 Tablet (20 mg total) by mouth Once a day    folic acid 0.8 mg Oral Capsule Take 1 Capsule (0.8 mg total) by mouth Once a day    Levocetirizine (XYZAL) 5 mg Oral Tablet Take 1 Tablet (5 mg total) by mouth Once per day as needed for Allergies    losartan (COZAAR) 25 mg Oral Tablet Take 1 Tablet (25 mg total) by mouth Once a day    nitroGLYCERIN (NITROSTAT) 0.4 mg Sublingual Tablet, Sublingual Place 1 Tablet (0.4 mg total) under the tongue Every 5 minutes as needed for Chest pain for 3 doses over 15 minutes    omeprazole (PRILOSEC) 20 mg Oral Capsule, Delayed Release(E.C.) Take 1 Capsule (20 mg total) by mouth Once a day    predniSONE (DELTASONE) 10 mg Oral Tablet Take 1 Tablet (10 mg total) by mouth Once a day for 7 days    promethazine-dextromethorphan (PHENERGAN-DM) 6.25-15 mg/5 mL Oral Syrup Take 5 mL by mouth Four times a day as needed for Cough for up to 6 days    traMADoL (ULTRAM) 50 mg Oral Tablet Take 1 Tablet (50 mg total) by mouth Twice per day as needed for Pain     Family History:  Family  Medical History:       Problem Relation (Age of Onset)    Cancer Mother    Coronary Artery Disease Father            Social History:  Social History     Socioeconomic History    Marital status: Divorced   Occupational History    Occupation: disabled d/t back   Tobacco Use    Smoking status: Former     Current packs/day: 0.00     Types: Cigarettes     Quit date: 01/23/2022     Years since quitting: 0.9    Smokeless tobacco: Current   Vaping  Use    Vaping status: Never Used   Substance and Sexual Activity    Alcohol use: Yes     Comment: rare social use only    Drug use: Never           Physical Exam:  Vital Signs:  Vitals:    01/05/23 1356   BP: 122/67   Pulse: 60   Temp: 36.4 C (97.6 F)   TempSrc: Temporal   SpO2: 92%   Weight: 92.5 kg (204 lb)   Height: 1.829 m (6')   BMI: 27.73     Physical Exam  Vitals and nursing note reviewed.   Constitutional:       Appearance: Normal appearance. He is normal weight.   HENT:      Head: Normocephalic.      Right Ear: Tympanic membrane and ear canal normal.      Left Ear: Tympanic membrane and ear canal normal.      Nose: Rhinorrhea present. Rhinorrhea is clear.      Right Turbinates: Enlarged and swollen.      Left Turbinates: Enlarged and swollen.      Mouth/Throat:      Pharynx: No pharyngeal swelling or posterior oropharyngeal erythema.      Comments: PND; pustule on R   Eyes:      General: Lids are normal. No scleral icterus.     Conjunctiva/sclera: Conjunctivae normal.   Neck:      Vascular: No carotid bruit.   Cardiovascular:      Rate and Rhythm: Normal rate and regular rhythm.      Pulses: Normal pulses.      Heart sounds: Normal heart sounds. No murmur heard.  Pulmonary:      Effort: Pulmonary effort is normal. No accessory muscle usage or respiratory distress.      Breath sounds: Normal breath sounds. No wheezing, rhonchi or rales.   Abdominal:      General: Bowel sounds are normal.      Palpations: Abdomen is soft.      Tenderness: There is no abdominal tenderness.  There is no guarding.   Musculoskeletal:      Right lower leg: No edema.      Left lower leg: No edema.   Lymphadenopathy:      Cervical: No cervical adenopathy.   Skin:     General: Skin is warm and dry.      Coloration: Skin is not jaundiced.      Comments: Tan from sun exposure    Neurological:      General: No focal deficit present.      Mental Status: He is alert.   Psychiatric:         Attention and Perception: Attention normal.         Mood and Affect: Mood normal.         Speech: Speech normal.         Behavior: Behavior normal. Behavior is cooperative.          Assessment:    ICD-10-CM    1. Coronary artery disease involving native coronary artery of native heart with angina pectoris (CMS HCC)  I25.119       2. Hx of CABG  Z95.1       3. History of heart artery stent  Z95.5       4. Chronic prostatitis  N41.1       5. GAD (generalized anxiety disorder)  F41.1  6. Other insomnia  G47.09       7. Gastroesophageal reflux disease, unspecified whether esophagitis present  K21.9       8. High vitamin D level  E67.3       9. Vitamin B12 deficiency  E53.8       10. Essential (primary) hypertension  I10       11. Chronic low back pain, unspecified back pain laterality, unspecified whether sciatica present  M54.50     G89.29            Plan:  Orders Placed This Encounter    doxycycline hyclate (VIBRAMYCIN) 100 mg Oral Capsule    predniSONE (DELTASONE) 10 mg Oral Tablet    promethazine-dextromethorphan (PHENERGAN-DM) 6.25-15 mg/5 mL Oral Syrup    Levocetirizine (XYZAL) 5 mg Oral Tablet    amitriptyline (ELAVIL) 10 mg Oral Tablet    ALPRAZolam (XANAX) 1 mg Oral Tablet     Chronic medical conditions reviewed and updated per HPI.    Reviewed previous labs.    CAD/history of CABG and stent:  No acute symptoms.  Denies chest pain. Cholesterol panel is well controlled.   Chronic prostatitis: No acute symptoms, referral is pending.    GAD: Symptoms are stable with current therapy, continue.  No indication of abuse.     Insomnia:  Hard to get comfortable at night he will move from bed to couch to recliner.  Heat is a trigger as well.  He will sleep for approximately 5 hours then wake for 2 hours.  Trial of amitriptyline and reassess.  He failed trial of trazodone.    Gastroesophageal reflux disease: Symptoms are well-controlled with current medication, continue.  Denies heartburn.    High vitamin-D: No acute symptoms.  Repeat lab had improved.    B12 deficiency: Adequate with current supplement.    Hypertension: BP is controlled to goal with current medications, continue.  Chronic low back pain: Symptoms are stable.    Acute sinusitis: Treatment as directed.  H/o tobacco use. Discussed indication for LDCT. He declined.     Depression screening is negative. PHQ 2 Total: 0     Time: 35    Return in about 4 months (around 05/08/2023) for cdm; schedule same day as annual wellness .    Elliot Gurney, FNP-BC     Portions of this note may be dictated using voice recognition software or a dictation service. Variances in spelling and vocabulary are possible and unintentional. Not all errors are caught/corrected. Please notify the [facility] Parkin if any discrepancies are noted or if the meaning of any statement is not clear.
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
## 2023-01-07 DIAGNOSIS — Z955 Presence of coronary angioplasty implant and graft: Secondary | ICD-10-CM | POA: Insufficient documentation

## 2023-02-06 ENCOUNTER — Ambulatory Visit (INDEPENDENT_AMBULATORY_CARE_PROVIDER_SITE_OTHER): Payer: MEDICAID | Admitting: Physician Assistant

## 2023-02-13 ENCOUNTER — Other Ambulatory Visit: Payer: Self-pay

## 2023-02-13 ENCOUNTER — Encounter (INDEPENDENT_AMBULATORY_CARE_PROVIDER_SITE_OTHER): Payer: Self-pay | Admitting: NURSE PRACTITIONER

## 2023-02-13 ENCOUNTER — Ambulatory Visit (INDEPENDENT_AMBULATORY_CARE_PROVIDER_SITE_OTHER): Payer: MEDICAID | Admitting: NURSE PRACTITIONER

## 2023-02-13 VITALS — BP 142/76 | HR 76 | Ht 72.0 in | Wt 207.0 lb

## 2023-02-13 DIAGNOSIS — I10 Essential (primary) hypertension: Secondary | ICD-10-CM

## 2023-02-13 DIAGNOSIS — I2510 Atherosclerotic heart disease of native coronary artery without angina pectoris: Secondary | ICD-10-CM

## 2023-02-13 DIAGNOSIS — F17210 Nicotine dependence, cigarettes, uncomplicated: Secondary | ICD-10-CM

## 2023-02-13 DIAGNOSIS — I255 Ischemic cardiomyopathy: Secondary | ICD-10-CM

## 2023-02-13 DIAGNOSIS — E785 Hyperlipidemia, unspecified: Secondary | ICD-10-CM

## 2023-02-13 MED ORDER — LOSARTAN 25 MG TABLET
50.0000 mg | ORAL_TABLET | Freq: Every day | ORAL | 4 refills | Status: DC
Start: 2023-02-13 — End: 2023-05-01

## 2023-02-13 NOTE — Progress Notes (Signed)
Cardiology Clinic [facility]    Name: Gabriel Best.  Age: 62 y.o.  Date of Service: 02/13/2023    Primary Care Provider: Elliot Gurney, FNP-BC  Chief Complaint:   Chief Complaint   Patient presents with    Follow Up 6 Months     Med Changes       HPI:  The patient has a history ofcoronary artery disease.  He had four-vessel bypass at age 88 after MI performed at [facility].  LV function was preserved.  In 2013, the patient had a non-ST elevation MI and underwent cardiac catheterization by Dr. Bari Edward at [facility] in [place].  He was found to have an occlusion in the vein graft to the left circumflex, which was borderline stenosis 60% to 70%.  There was 75% stenosis in the mid RCA that had a patent vein graft and there was also patent graft to the diagonal.  Stress test in 2018 for preoperative assessment was low risk study.  EF was 64% on resting imaging.  There was no TID.  There was a moderate size apical perfusion defect that was largely fixed consistent with previous defect just slightly extended.  Repeat stress test in May 2022 was a normal, low risk study, normal myocardial perfusion imaging.  EF was slightly reduced to 46%. Echocardiogram in March 2022 showed EF 45% with moderate aortic insufficiency.  cath on 11/23/21 which resulted in PCI to the RCA, staged PCI to the PLB and distal RCA on 12/13/21, and then staged PCI to the SVG to the diagonal as well as to the left main/LCX/OM2.  Chest pain did improve with initial PCI to the RCA.      11/03/21 The patient is here due to chest pain.  He states he developed chest pain that woke him up Sunday night.  It started off on the left side but then radiated to the right side and then midsternal.  Symptoms within radiate up through withdrawal similar to reflux.  He used to take Prilosec for reflux but stopped this several years ago.  Sometimes position change will help with symptoms and he was able to get back to sleep, but then  symptoms persisted throughout the day the next day.  He ended up going to the emergency room for evaluation.  His cardiac workup was negative.  His symptoms did improve with a GI cocktail.  He was diagnosed with pneumonia, but chest x-ray showed chronic changes and he had no symptoms related to pneumonia.  He is now on Pepcid and Prilosec, but he continues to have symptoms.     02/14/22 The patient is here for f/u from heart catheterizations, initial cath on 11/23/21 which resulted in PCI to the RCA, staged PCI to the PLB and distal RCA on 12/13/21, and then staged PCI to the SVG to the diagonal as well as to the left main/LCX/OM2.  Chest pain did improve with initial PCI to the RCA.  He states now his shortness of breath is also improving.  He still has some at baseline due to his history of smoking, but he has much more energy now.  He is doing his previous tasks at home that he was unable to do earlier in the spring.  He feels like he is slowly continuing to improve.     08/15/22 The patient is here for CAD follow-up.  He denies any significant chest pains.  Shortness of breath is still improving.  He is able  to quit smoking back in December.  He has gained a little weight since then.  He was little confused on his blood pressure medicine, he was not sure if he was taking lisinopril and amlodipine, but he thinks he is just taking amlodipine.  He states he was switch back and forth and 1 of them was causing leg swelling.      02/13/2023 The patient is here for CAD follow up. He denies any significant chest pains.  Shortness of breath is still improving.  He was able to quit smoking back in December 2023, but now smokes a cigarette at time when he is stressed. The blood pressure is elevated 142/76. He has not been checking BP at home recently. He is tolerating aspirin and Plavix without bleeding. He is having changes with insurance and his disability. His income is decreasing, He recently lost his car.       Labs: 10/2022  K+ 3.8, Cr 1.62, LDL 61, Hgb 14.6, Plt 126    EKG: SR 75bpm      Past Medical History:  Past Medical History:   Diagnosis Date    Anxiety and depression     Atherosclerotic heart disease of native coronary artery without angina pectoris     Chronic back pain     Chronic prostatitis     Urologist Dr Corwin Levins in the past    CKD (chronic kidney disease) stage 3, GFR 30-59 ml/min (CMS HCC)     Coronary disease     Essential hypertension     GERD (gastroesophageal reflux disease)     High vitamin D level     Hyperlipidemia     Renal insufficiency     Tobacco abuse     Vitamin B12 deficiency          Social History:  Social History     Tobacco Use   Smoking Status Former    Current packs/day: 0.00    Types: Cigarettes    Quit date: 01/23/2022    Years since quitting: 1.0   Smokeless Tobacco Current      Current Medications:  Current Outpatient Medications   Medication Sig    albuterol sulfate (PROVENTIL OR VENTOLIN OR PROAIR) 90 mcg/actuation Inhalation oral inhaler Take 1-2 Puffs by inhalation Every 6 hours as needed    ALPRAZolam (XANAX) 1 mg Oral Tablet Take 1 Tablet (1 mg total) by mouth Twice per day as needed    amitriptyline (ELAVIL) 10 mg Oral Tablet Take 1 Tablet (10 mg total) by mouth Every night    aspirin (ECOTRIN) 81 mg Oral Tablet, Delayed Release (E.C.) Take 1 Tablet (81 mg total) by mouth Once a day    atorvastatin (LIPITOR) 40 mg Oral Tablet TAKE 1 TABLET BY MOUTH ONCE DAILY FOR CHOLESTEROL    citalopram (CELEXA) 20 mg Oral Tablet Take 1 tablet by mouth once daily    clopidogreL (PLAVIX) 75 mg Oral Tablet Take 1 tablet by mouth once daily    cyanocobalamin (VITAMIN B12) 1,000 mcg/mL Injection Solution INJECT 1 ML (CC) INTRAMUSCULARLY ONCE EVERY MONTH (EVERY 30 DAYS)    famotidine (PEPCID) 20 mg Oral Tablet Take 1 Tablet (20 mg total) by mouth Once a day    folic acid 0.8 mg Oral Capsule Take 1 Capsule (0.8 mg total) by mouth Once a day    Levocetirizine (XYZAL) 5 mg Oral Tablet Take 1 Tablet (5 mg total) by  mouth Once per day as needed for Allergies    losartan (COZAAR)  25 mg Oral Tablet Take 1 Tablet (25 mg total) by mouth Once a day    nitroGLYCERIN (NITROSTAT) 0.4 mg Sublingual Tablet, Sublingual Place 1 Tablet (0.4 mg total) under the tongue Every 5 minutes as needed for Chest pain for 3 doses over 15 minutes    omeprazole (PRILOSEC) 20 mg Oral Capsule, Delayed Release(E.C.) Take 1 Capsule (20 mg total) by mouth Once a day    traMADoL (ULTRAM) 50 mg Oral Tablet Take 1 Tablet (50 mg total) by mouth Twice per day as needed for Pain     Allergies:  No Known Allergies   Review of Systems:  Complete ROS was performed and otherwise negative unless noted in HPI.    Vital Signs:  Vitals:    02/13/23 0917   BP: (!) 142/76   Pulse: 76   SpO2: 96%   Weight: 93.9 kg (207 lb)   Height: 1.829 m (6')   BMI: 28.13      Physical Exam:  General: Pt resting comfortably in no acute distress and appears stated age.    Neck: No JVD, no carotid bruit. Neck supple, symmetrical, trachea midline.   Lungs:  Normal respiratory effort, lungs clear to auscultation bilaterally.    Cardiovascular:  Regular rate and rhythm.  Normal S1 and S2 without murmur, no gallop, or rub.  Abdomen: Soft, non-tender and bowel sounds normal.    Extremities: No edema.  Neurologic: Alert and oriented x3.     Assessment/Plan:  1. CAD in native artery  Stable. Will continue DAPT d/t intermittent smoking. Continue Aspirin 81 mg daily, Plavix 75 mg daily. Continue Lipitor 40 mg daily. Encourage absolute smoking cessation.   - EKG (In-Clinic Today)    2. Ischemic cardiomyopathy  Continue Losartan will increase to 50 mg daily. Unable to tolerate BB d/t bradycardia. He is having changes with insurance and his disability which is a deterrent to starting additional GDMT.   - EKG (In-Clinic Today)    3. Essential hypertension  Continue Losartan will increase to 50 mg daily. Goal 110-130/60-80.   - EKG (In-Clinic Today)    4. Hyperlipidemia with target low density  lipoprotein (LDL) cholesterol less than 70 mg/dL  LDL within target at 61. Continue Lipitor 40 mg daily.     RTC 6 months      Orders placed this visit:  Orders Placed This Encounter    EKG (In-Clinic Today)       Gabriel Best. is to return to clinic for follow up with the understanding that should symptoms change or worsen he is to call the office or go to the closest emergency department for evaluation.    Evangelia Whitaker, FNP-C    A portion of this documentation may have been generated using MMODAL voice recognition software and may contain syntax/voice recognition errors.

## 2023-02-14 ENCOUNTER — Ambulatory Visit (INDEPENDENT_AMBULATORY_CARE_PROVIDER_SITE_OTHER): Payer: Self-pay | Admitting: Physician Assistant

## 2023-02-14 LAB — ECG W INTERP (AMB USE ONLY)(MUSE,IN CLINIC)
Atrial Rate: 75 {beats}/min
Calculated P Axis: 59 degrees
Calculated R Axis: 22 degrees
Calculated T Axis: 49 degrees
PR Interval: 148 ms
QRS Duration: 96 ms
QT Interval: 416 ms
QTC Calculation: 464 ms
Ventricular rate: 75 {beats}/min

## 2023-03-13 ENCOUNTER — Ambulatory Visit (INDEPENDENT_AMBULATORY_CARE_PROVIDER_SITE_OTHER): Payer: MEDICAID | Admitting: Physician Assistant

## 2023-03-19 ENCOUNTER — Ambulatory Visit (INDEPENDENT_AMBULATORY_CARE_PROVIDER_SITE_OTHER): Payer: MEDICAID | Admitting: Physician Assistant

## 2023-05-01 ENCOUNTER — Other Ambulatory Visit: Payer: Medicare Other | Attending: Family | Admitting: Family

## 2023-05-01 ENCOUNTER — Encounter (INDEPENDENT_AMBULATORY_CARE_PROVIDER_SITE_OTHER): Payer: Self-pay | Admitting: Family

## 2023-05-01 ENCOUNTER — Ambulatory Visit (INDEPENDENT_AMBULATORY_CARE_PROVIDER_SITE_OTHER): Payer: Medicare Other | Admitting: Family

## 2023-05-01 ENCOUNTER — Other Ambulatory Visit: Payer: Self-pay

## 2023-05-01 VITALS — BP 114/60 | HR 75 | Temp 97.4°F | Ht 70.25 in | Wt 219.0 lb

## 2023-05-01 DIAGNOSIS — Z955 Presence of coronary angioplasty implant and graft: Secondary | ICD-10-CM

## 2023-05-01 DIAGNOSIS — Z951 Presence of aortocoronary bypass graft: Secondary | ICD-10-CM

## 2023-05-01 DIAGNOSIS — I25119 Atherosclerotic heart disease of native coronary artery with unspecified angina pectoris: Secondary | ICD-10-CM

## 2023-05-01 DIAGNOSIS — N411 Chronic prostatitis: Secondary | ICD-10-CM | POA: Insufficient documentation

## 2023-05-01 DIAGNOSIS — Z0000 Encounter for general adult medical examination without abnormal findings: Secondary | ICD-10-CM

## 2023-05-01 DIAGNOSIS — K219 Gastro-esophageal reflux disease without esophagitis: Secondary | ICD-10-CM

## 2023-05-01 DIAGNOSIS — I10 Essential (primary) hypertension: Secondary | ICD-10-CM | POA: Insufficient documentation

## 2023-05-01 DIAGNOSIS — I1310 Hypertensive heart and chronic kidney disease without heart failure, with stage 1 through stage 4 chronic kidney disease, or unspecified chronic kidney disease: Secondary | ICD-10-CM

## 2023-05-01 DIAGNOSIS — M4802 Spinal stenosis, cervical region: Secondary | ICD-10-CM

## 2023-05-01 DIAGNOSIS — F419 Anxiety disorder, unspecified: Secondary | ICD-10-CM

## 2023-05-01 DIAGNOSIS — M545 Low back pain, unspecified: Secondary | ICD-10-CM

## 2023-05-01 DIAGNOSIS — I255 Ischemic cardiomyopathy: Secondary | ICD-10-CM

## 2023-05-01 DIAGNOSIS — Z23 Encounter for immunization: Secondary | ICD-10-CM

## 2023-05-01 DIAGNOSIS — E673 Hypervitaminosis D: Secondary | ICD-10-CM

## 2023-05-01 DIAGNOSIS — E538 Deficiency of other specified B group vitamins: Secondary | ICD-10-CM

## 2023-05-01 DIAGNOSIS — G4709 Other insomnia: Secondary | ICD-10-CM

## 2023-05-01 DIAGNOSIS — N183 Chronic kidney disease, stage 3 unspecified (CMS HCC): Secondary | ICD-10-CM

## 2023-05-01 DIAGNOSIS — F411 Generalized anxiety disorder: Secondary | ICD-10-CM

## 2023-05-01 DIAGNOSIS — R29890 Loss of height: Secondary | ICD-10-CM

## 2023-05-01 DIAGNOSIS — Z125 Encounter for screening for malignant neoplasm of prostate: Secondary | ICD-10-CM | POA: Insufficient documentation

## 2023-05-01 DIAGNOSIS — G8929 Other chronic pain: Secondary | ICD-10-CM

## 2023-05-01 LAB — BASIC METABOLIC PANEL
ANION GAP: 9 mmol/L (ref 4–13)
BUN/CREA RATIO: 13 (ref 6–22)
BUN: 21 mg/dL (ref 7–25)
CALCIUM: 9.5 mg/dL (ref 8.6–10.3)
CHLORIDE: 102 mmol/L (ref 98–107)
CO2 TOTAL: 28 mmol/L (ref 21–31)
CREATININE: 1.58 mg/dL — ABNORMAL HIGH (ref 0.60–1.30)
ESTIMATED GFR: 49 mL/min/{1.73_m2} — ABNORMAL LOW (ref >59)
GLUCOSE: 68 mg/dL — ABNORMAL LOW (ref 74–109)
OSMOLALITY, CALCULATED: 279 mosm/kg (ref 270–290)
POTASSIUM: 4.3 mmol/L (ref 3.5–5.1)
SODIUM: 139 mmol/L (ref 136–145)

## 2023-05-01 LAB — HEPATIC FUNCTION PANEL
ALBUMIN/GLOBULIN RATIO: 1.3 (ref 0.8–1.4)
ALBUMIN: 4.3 g/dL (ref 3.5–5.7)
ALKALINE PHOSPHATASE: 90 U/L (ref 34–104)
ALT (SGPT): 9 U/L (ref 7–52)
AST (SGOT): 13 U/L (ref 13–39)
BILIRUBIN DIRECT: 0.2 md/dL — ABNORMAL HIGH (ref 0.03–0.18)
BILIRUBIN TOTAL: 0.9 mg/dL (ref 0.3–1.0)
BILIRUBIN, INDIRECT: 0.7 mg/dL (ref <=1)
GLOBULIN: 3.2 (ref 2.0–3.5)
PROTEIN TOTAL: 7.5 g/dL (ref 6.4–8.9)

## 2023-05-01 LAB — LIPID PANEL
CHOL/HDL RATIO: 3.2
CHOLESTEROL: 137 mg/dL (ref <200)
HDL CHOL: 43 mg/dL (ref >=40)
LDL CALC: 79 mg/dL (ref 0–100)
TRIGLYCERIDES: 75 mg/dL (ref <=150)
VLDL CALC: 15 mg/dL (ref 0–50)

## 2023-05-01 LAB — CBC WITH DIFF
BASOPHIL #: 0 10*3/uL (ref 0.00–0.10)
BASOPHIL %: 1 % (ref 0–1)
EOSINOPHIL #: 0.2 10*3/uL (ref 0.00–0.50)
EOSINOPHIL %: 3 % (ref 1–8)
HCT: 45.6 % (ref 36.7–47.1)
HGB: 15.6 g/dL (ref 12.5–16.3)
LYMPHOCYTE #: 2.4 10*3/uL (ref 1.00–3.00)
LYMPHOCYTE %: 34 % (ref 16–44)
MCH: 31.3 pg (ref 23.8–33.4)
MCHC: 34.2 g/dL (ref 32.5–36.3)
MCV: 91.5 fL (ref 73.0–96.2)
MONOCYTE #: 0.7 10*3/uL (ref 0.30–1.00)
MONOCYTE %: 10 % (ref 5–13)
MPV: 9.2 fL (ref 7.4–11.4)
NEUTROPHIL #: 3.8 10*3/uL (ref 1.85–7.80)
NEUTROPHIL %: 53 % (ref 43–77)
PLATELETS: 147 10*3/uL (ref 140–440)
RBC: 4.98 10*6/uL (ref 4.06–5.63)
RDW: 13 % (ref 12.1–16.2)
WBC: 7.2 10*3/uL (ref 3.6–10.2)

## 2023-05-01 LAB — MAGNESIUM: MAGNESIUM: 2.2 mg/dL (ref 1.9–2.7)

## 2023-05-01 LAB — VITAMIN D 25 TOTAL: VITAMIN D 25, TOTAL: 91.03 ng/mL (ref 30.00–100.00)

## 2023-05-01 LAB — THYROID STIMULATING HORMONE (SENSITIVE TSH): TSH: 1.985 u[IU]/mL (ref 0.450–5.330)

## 2023-05-01 LAB — VITAMIN B12: VITAMIN B 12: 460 pg/mL (ref 180–914)

## 2023-05-01 LAB — PSA SCREENING: PSA: 0.29 ng/mL (ref <=4.00)

## 2023-05-01 MED ORDER — LOSARTAN 25 MG TABLET
25.0000 mg | ORAL_TABLET | Freq: Every day | ORAL | 1 refills | Status: DC
Start: 2023-05-01 — End: 2023-08-16

## 2023-05-01 MED ORDER — AMITRIPTYLINE 10 MG TABLET
ORAL_TABLET | ORAL | 0 refills | Status: DC
Start: 2023-05-01 — End: 2023-08-16

## 2023-05-01 MED ORDER — CITALOPRAM 20 MG TABLET
20.0000 mg | ORAL_TABLET | Freq: Every day | ORAL | 1 refills | Status: DC
Start: 2023-05-01 — End: 2023-10-23

## 2023-05-01 MED ORDER — CYANOCOBALAMIN (VIT B-12) 1,000 MCG/ML INJECTION SOLUTION
1000.0000 ug | INTRAMUSCULAR | 1 refills | Status: DC
Start: 2023-05-01 — End: 2023-11-13

## 2023-05-01 MED ORDER — ALPRAZOLAM 1 MG TABLET
1.0000 mg | ORAL_TABLET | Freq: Two times a day (BID) | ORAL | 3 refills | Status: DC | PRN
Start: 2023-05-01 — End: 2023-08-30

## 2023-05-01 NOTE — Patient Instructions (Signed)
Medicare Preventive Services  Medicare coverage information Recommendation for YOU   Heart Disease and Diabetes   Lipid profile every 5 years or more often if at risk for cardiovascular disease  Lab Results   Component Value Date    CHOLESTEROL 112 08/28/2022    HDLCHOL 41 08/28/2022    LDLCHOL 61 08/28/2022    TRIG 50 08/28/2022       Diabetes Screening with Blood Glucose test or Glucose Tolerance Test Yearly for those at risk for diabetes, up to two tests per year for those with prediabetes  Last Glucose: 127     Diabetes Self-Management Training   Initial training ten hours per year, and follow-up training two hours per subsequent year. Optional for those with diabetes    Medical Nutrition Therapy   Three hours of one-on-one counseling in first year, two hours in subsequent years. Optional for those with diabetes, kidney disease   Intensive Behavioral Therapy for Obesity  Face-to-face counseling, first month every week, month 2-6 every other week, month 7-12 every month if continued progress is documented Optional for those with Body Mass Index 30 or higher  Your Body mass index is 31.2 kg/m.   Tobacco Cessation (Quitting) Counseling   Two attempts per year, max 4 sessions per attempt, up to 8 sessions per year Optional for those who use tobacco    Cancer Screening Last Completion Date   Colorectal screening   For anyone age 39 to 67 or any age if high risk:  Screening Colonoscopy every 10 yrs if low risk,  more frequent if higher risk  OR  Cologuard Stool DNA test once every 3 years OR  Fecal Occult Blood Testing yearly OR  Flexible  Sigmoidoscopy  every 5 yr OR  CT Colonography every 5 yrs    See below for due date if applicable.   Prostate Cancer Screening  Prostate Specific Antigen blood test based on joint decision making with your provider for ages 58-69  A joint decision between you and your primary care provider   Lung Cancer Screening  Annual low dose computed tomography (LDCT scan) is recommended for  those age 21-80 who smoked 20 pack-years and are current smokers or quit smoking within past 15 years, after counseling by your doctor or nurse clinician about the possible benefits or harms.   See below for due date if applicable.   Vaccinations   Respiratory syncytial virus (RSV)  Age 62 years or older: Based on shared clinical decision-making with your provider.  Pneumococcal Vaccine  Recommended routinely age 38+ with one or two separate vaccines based on your risk. Recommended before age 3 if medical conditions with increased risk  Seasonal Influenza Vaccine  Once every flu season   Hepatitis B Vaccine  3 doses if risk (including anyone with diabetes or liver disease)  Shingles Vaccine  Two doses at age 88 or older  Diphtheria Tetanus Pertussis Vaccine  ONCE as adult, booster every 10 years     Immunization History   Administered Date(s) Administered   . Covid-19 Vaccine,Pfizer-BioNTech,Purple Top,36yrs+ 02/22/2020, 03/14/2020   . Influenza Vaccine, 6 month-adult 05/03/2018, 03/21/2019, 05/26/2022   . PREVNAR 20 05/26/2022     Shingles vaccine and Diphtheria Tetanus Pertussis vaccines are available at pharmacies or local health department without a prescription.   Other Preventative Screening  Last Completion Date   Glaucoma Screening   Yearly if in high risk group such as diabetes, family history, African American age 18+ or Hispanic American age  65+ See your Eye Care Provider   Hepatitis C Screening   Recommended  for those born between ages 18-79 years.   See below for due date if applicable.     HIV Testing  Recommended routinely at least ONCE, covered every year for age 59 to 34 regardless of risk, and every year for age over 60 who ask for the test or higher risk. Yearly or up to 3 times in pregnancy    See below for due date if applicable.  Bone Densitometry   Screening is recommended for Men ages 55 and above with one or more risk factor: androgen deprivation therapy for prostate cancer, hypogonadism,  frailty, primary hyperparathyroidism, hyperthyroidism  For men diagnosed with osteoporosis, follow up is recommended every years or a frequency recommended by your provider     See below for due date if applicable.   Abdominal Aortic Aneurysm Screening Ultrasound   Once with a family history of abdominal aortic aneurysms OR a male between65-75 and have smoked at least 100 cigarettes in your lifetime.     See below for due date if applicable.       Your Personalized Schedule for Preventive Tests   Health Maintenance: Pending and Last Completed       Date Due Completion Date    Adult Tdap-Td (1 - Tdap) Never done ---    Shingles Vaccine (1 of 2) Never done ---    Influenza Vaccine (1) 02/18/2023 05/26/2022    Covid-19 Vaccine (3 - 2024-25 season) 02/18/2023 03/14/2020    NonMedicare Preventative Exam 04/28/2023 04/27/2022    Prostate Cancer Screening 02/01/2024 01/31/2022    Depression Screening 04/30/2024 05/01/2023    Colonoscopy 04/20/2031 ---           Non-Opioid Treatment for Chronic Pains   Treatment for chronic pain can be managed without opioids. Below are non-opioid options that may be considered and discussed with your provider to determine which options would be best for your health.    Over the counter or presciptions medications:  Acetaminophen (Tylenol) or Non-steroidal anti-inflammatories such as: Ibuprofen (Motrin, Advil), naproxen (Aleve), aspirin  Antidepressants such as amitriptyline, nortriptyline (Pamelor),  Doxepin (Silenor), Imipramine (Tofranil) and others.  Anticonvulsant Nerve pain medications: Gabapentin (Neurontin), pregabalin (Lyrica)  Externally applied medications such as NSAID'S, lidocaine, capsaisin, and others  Injections: pain specialists can sometimes inject medications at the site of pain.    Alternative therapies such as  Acupuncture  Osteopathic manipulation  Chiropractic  Massage therapy       For Information on Advanced Directives for Health Care:  [place]:   [email or web address]  [place], [place], [place], [place] General Information: [email or web address]

## 2023-05-01 NOTE — Nursing Note (Signed)
05/01/23 0909   Comprehensive Health Assessment-Adult   Do you wish to complete this form? Yes   During the past 4 weeks, how would you rate your health in general? Good   During the past 4 weeks, how much difficulty have you had doing your usual activities inside and outside your home because of medical or emotional problems? No difficulty at all   During the past 4 weeks, was someone available to help you if you needed and wanted help? Yes, some   In the past year, how many times have you gone to the emergency department or been admitted to a hospital for a health problem? None   Are you generally satisfied with your sleep? No   Do you have enough money to buy things you need in everyday life, such as food, clothing, medicines, and housing? Yes, always   Can you get to places beyond walking distance without help?  (For example, can you drive your own car or travel alone on buses)? Yes   Do you fasten your seatbelt when you are in a car? Yes, usually   Do you exercise 20 minutes 3 or more days per week (such as walking, dancing, biking, mowing grass, swimming)? Yes, some of the time   How often do you eat food that is healthy (fruits, vegetables, lean meats) instead of unhealthy (sweets, fast food, junk food, fatty foods)? Most of the time   Have your parents, brothers or sisters had any of the following problems before the age of 71? (check all that apply) Heart problems, or hardening of the arteries;Cancer;High cholesterol   How often do you have trouble taking medicines the eay you are told to take them? I always take them as prescribed   Do you need any help communicating with your doctors and nurses because of vision or hearing problems? No   During the past 12 months, have you experienced confusion or memory loss that is happening more often or is getting worse? No   Do you have one person you think of as your personal doctor (primary care provider or family doctor)? Yes   If you are seeing a Primary Care  Provider (PCP) or family doctor. please list their name Caron Presume   Are you now also seeing any specialist physician(s) (such as eye doctor, foot doctor, skin doctor)? Yes   If you are seeing a specialist for anything such as foot, eye, skin, etc.  please list their name(s) Cardiologist   How confident are you that you can control or manage most of your health problems? Very confident

## 2023-05-01 NOTE — Nursing Note (Signed)
05/01/23 0907   Medicare Wellness Assessment   Medicare initial or wellness physical in the last year? Yes   Advance Directives   Does patient have a living will or MPOA Yes   Has patient provided [employer] with a copy? Yes   Activities of Daily Living   Do you need help with dressing, bathing, or walking? No   Do you need help with shopping, housekeeping, medications, or finances? No   Do you have rugs in hallways, broken steps, or poor lighting? Yes  (rugs)   Do you have grab bars in your bathroom, non-slip strips in your tub, and hand rails on your stairs? Yes   Cognitive Function Screen   What is you age? 1   What is the time to the nearest hour? 1   Remember this address: [address]   What is the year? 1   What is the name of this clinic? 1   Can the patient recognize two persons (the doctor, the nurse, home help, etc.)? 1   What is the date of your birth? (day and month sufficient)  1   In what year did World War II end? 0   Who is the current president of the [place]? 1   Count from 20 down to 1? 1   What address did I give you earlier? 1   Total Score 9   Interpretation of Total Score Greater than 6 Normal   Depression Screen   Little interest or pleasure in doing things. 0   Feeling down, depressed, or hopeless 0   PHQ 2 Total 0   Trouble falling or staying asleep, or sleeping too much. 3   Feeling tired or having little energy 3   Poor appetite or overeating 0   Feeling bad about yourself/ that you are a failure in the past 2 weeks? 0   Trouble concentrating on things in the past 2 weeks? 0   Moving/Speaking slowly or being fidgety or restless  in the past 2 weeks? 0   Thoughts that you would be better off DEAD, or of hurting yourself in some way. 0   If you checked off any problems, how difficult have these problems made it for you to do your work, take care of things at home, or get along with other people? Somewhat difficult   PHQ 9 Total 6   Pain Score   Pain Score Zero    Substance Use Screening   In Past 12 MONTHS, how often have you used any tobacco product (for example, cigarettes, e-cigarettes, cigars, pipes, or smokeless tobacco)? Daily   In the PAST 3 MONTHS, did you smoke a cigarette containing tobacco or use any other nicotine delivery product (i.e., e-cigarette, vaping or chewing tobacco)? Yes   In the PAST 3 MONTHS, did you usually smoke more than 10 cigarettes, vape, use an e-cigarette or chew tobacco more than 10 times each day? No   In the PAST 3 MONTHS, did you usually smoke/use an e-cigarette, vape or chew tobacco within 30 minutes after waking? No   In the PAST 12 MONTHS, how often have you had 5 (men)/4 (women) or more drinks containing alcohol in one day? Never   In the PAST 12 months, how often have you used any prescription medications just for the feeling, more than prescribed, or that were not prescribed for you? Prescriptions may include: opioids, benzodiazepines, medications for ADHD Never   In the PAST 12  MONTHS, how often have you used any drugs, including marijuana, cocaine or crack, heroin, methamphetamine, hallucinogens, ecstasy/MDMA? Never   Hearing Screen   Have you noticed any hearing difficulties? No   After whispering 9-1-6 how many numbers did the patient repeat correctly? 3   Fall Risk Assessment   Do you feel unsteady when standing or walking? No   Do you worry about falling? No   Have you fallen in the past year? Yes   How many times have you fallen? Once   Were you ever injured from falling? No   Timed up and go test (in seconds) 2

## 2023-05-01 NOTE — Nursing Note (Signed)
05/01/23 0915   PHQ 9 (follow up)   Little interest or pleasure in doing things. 0   Feeling down, depressed, or hopeless 0   Trouble falling or staying asleep, or sleeping too much. 3   Feeling tired or having little energy 3   Poor appetite or overeating 0   Feeling bad about yourself/ that you are a failure in the past 2 weeks? 0   Trouble concentrating on things in the past 2 weeks? 0   Moving/Speaking slowly or being fidgety or restless  in the past 2 weeks? 0   Thoughts that you would be better off DEAD, or of hurting yourself in some way. 0   If you checked off any problems, how difficult have these problems made it for you to do your work, take care of things at home, or get along with other people? Somewhat difficult   PHQ 9 Total 6   Interpretation of Total Score Mild depression

## 2023-05-01 NOTE — Progress Notes (Signed)
FAMILY MEDICINE, MEDICAL OFFICE BUILDING  [address]  [place] [address]          Name: Gabriel Best. MRN:  [id number]   Date: 05/01/2023 Age: 62 y.o.          Provider: Elliot Gurney, FNP-BC    Reason for visit: Follow Up 4 Months and Labs Only      History of Present Illness:  Gabriel Best Gabriel Best. is a 62 y.o. male presenting with     Recurrent nasal congestion.   Previously failed multiple nasal sprays including Flonase and Nasonex. No significant change with Xyzal, no recent use. Some ongoing cough. CXR Nov 2023.   Med: albuterol PRN  Xyzal PRN     Chronic prostatitis  No acute urinary symptoms. Rescheduled appt with urology to Nov 2024.     Extensive cardiac history, following with cardiologist.   Cardiac cath July 2023. Chronic DOE.   Has had 3 PCI with total of 9 stents.   ECG at cardio Feb 2024/Aug 2024.  H/o MI and CABG.   H/o blood donation d/t elevated Hgb.   Meds:   NTG PRN- has not had to use since last stent  Aspirin 81 mg daily  Atorvastatin 40 mg daily   Clopidogrel 75 mg daily  Losartan 25 mg daily (failed 50 mg Aug 2024, reports he felt bad)  No BB d/t bradycardia.   Previous:   Lisinopril 5mg  1/2 tab daily and amlodipine: changed to losartan d/t cardiomyopathy.   Consults: Dr Elesa Massed  Cardio f/u Aug 2024- continue dual anti-platelet.     CKD  Onset: May 2023 or before     Anxiety/insomnia  Onset: age 42s  Aug 2023: long-standing use of alprazolam d/t h/o anxiety attacks. He ran out recently during change in PCP and had exacerbation of symptoms. I reviewed risks of withdrawals. BOP consistent, no indication of abuse. Denies SI. No SE of meds. No respiratory depression. He waits at least 1 hour between alprazolam and tramadol when he uses it.   Meds:   Alprazolam 1 mg BID  Citalopram 20 mg daily  Failed melatonin  Failed trazodone March 2024 (ineffective)   Trial of amitriptyline July 2024 no help so stopped it. Resume with titration Nov 2024.  Consults: none       GERD  Investigations:   No EGD that he is aware of  Colonoscopy Winter 2022  Meds:   Famotidine 20 mg daily PRN  Omeprazole 20 mg daily     Vitamin B12 Def  Med:  Monthly injections (self administers)   Folate      Elevated vitamin D  Lab: > 120 Aug 2023; 96 Nov 2023  PTH 72.21 Jan 2022  Nov 2023: Denies supplement.  Nov 2024: ht loss. Order for BMD.      Chronic back pain/DDD/bil knee pain  Hx: cervical fusion, bil knee surgery   Reports back XR 2022 at [facility]- requested Nov 2024  March 2024: may use tramadol twice a month, couple of doses each episode. Declines refill needed.   Med: tramadol PRN only  Was on Lortab previously.      Tobacco Use  Nov 2024: did not resume smoking. Smokeless tobacco 1 can q 2-3 days.   March 2024: stopped smoking Dec 2023. Some smokeless tobacco.   Nov 2023: decreased smoking x couple of months, occ dips. Not having a cigarette everyday. Has a dip most days with activity such as walking dog.  Aug 2023: he is motivated to try to quit. Candy helps. He had gotten down to 4-5 cig daily until he ran out of alprazolam and resumed 1 ppd. Verbalized intent to decrease.   Onset: age 51 Quit x 11 years at age 66 after MI. Restarted age 55 during divorce. Avg 1 PPD. Quit Dec 2023. Approx 34 pack year history.   Declined LDCT July 2024, Nov 2024.     A1C: 5.4  A1C Date: 01/31/2022  COMPLETE BLOOD COUNT   Lab Results   Component Value Date    WBC 6.1 08/28/2022    HGB 14.6 08/28/2022    HCT 42.6 08/28/2022    PLTCNT 126 (L) 08/28/2022       DIFFERENTIAL  Lab Results   Component Value Date    PMNS 65 08/28/2022    LYMPHOCYTES 21 08/28/2022    MONOCYTES 11 08/28/2022    EOSINOPHIL 3 08/28/2022    BASOPHILS 0 08/28/2022    BASOPHILS 0.00 08/28/2022    PMNABS 4.00 08/28/2022    LYMPHSABS 1.30 08/28/2022    EOSABS 0.20 08/28/2022    MONOSABS 0.70 08/28/2022     BASIC METABOLIC PANEL  Lab Results   Component Value Date    SODIUM 140 11/03/2022    POTASSIUM 3.8 11/03/2022    CHLORIDE 104 11/03/2022    CO2  26 11/03/2022    ANIONGAP 10 11/03/2022    BUN 15 11/03/2022    CREATININE 1.62 (H) 11/03/2022    BUNCRRATIO 9 11/03/2022    GFR 48 (L) 11/03/2022    CALCIUM 8.9 11/03/2022    GLUCOSE Negative 11/03/2022    GLUCOSENF 127 (H) 11/03/2022      LIPID PROFILE  Lab Results   Component Value Date    CHOLESTEROL 112 08/28/2022    HDLCHOL 41 08/28/2022    LDLCHOL 61 08/28/2022    TRIG 50 08/28/2022       LIVER TESTS  Lab Results   Component Value Date    ALBUMIN 3.9 04/27/2022    AST 15 04/27/2022    ALT 8 04/27/2022    ALKPHOS 96 04/27/2022    TOTBILIRUBIN 0.8 04/27/2022    BILIRUBINCON 0.19 04/27/2022    INR 1.03 01/10/2022     PROSTATE SPECIFIC ANTIGEN  Lab Results   Component Value Date    PROSSPECAG 0.28 01/31/2022       THYROID STIMULATING HORMONE  Lab Results   Component Value Date    TSH 1.237 01/31/2022     VITAMIN B12  Lab Results   Component Value Date    VITB12 761 01/31/2022     VITAMIN D 25-HYDROXY  Lab Results   Component Value Date    VITD 96 04/27/2022        Historical Data    Past Medical History:  Past Medical History:   Diagnosis Date    Anxiety and depression     Atherosclerotic heart disease of native coronary artery without angina pectoris     Chronic back pain     Chronic prostatitis     Urologist Dr Corwin Levins in the past    CKD (chronic kidney disease) stage 3, GFR 30-59 ml/min (CMS HCC)     Coronary disease     Essential hypertension     GERD (gastroesophageal reflux disease)     High vitamin D level     Hyperlipidemia     Renal insufficiency     Tobacco abuse     Vitamin B12 deficiency  Past Surgical History:  Past Surgical History:   Procedure Laterality Date    CARDIAC CATHETERIZATION  06/19/2012    HX CORONARY ARTERY BYPASS GRAFT      HX KNEE SURGERY Bilateral     Dr Lindwood Qua    HX LAP CHOLECYSTECTOMY      NECK SURGERY      Vertebraes fused back together         Allergies:  No Known Allergies  Medications:  Current Outpatient Medications   Medication Sig    albuterol sulfate (PROVENTIL OR  VENTOLIN OR PROAIR) 90 mcg/actuation Inhalation oral inhaler Take 1-2 Puffs by inhalation Every 6 hours as needed    ALPRAZolam (XANAX) 1 mg Oral Tablet Take 1 Tablet (1 mg total) by mouth Twice per day as needed    amitriptyline (ELAVIL) 10 mg Oral Tablet Take 1 Tablet (10 mg total) by mouth Every night    aspirin (ECOTRIN) 81 mg Oral Tablet, Delayed Release (E.C.) Take 1 Tablet (81 mg total) by mouth Once a day    atorvastatin (LIPITOR) 40 mg Oral Tablet TAKE 1 TABLET BY MOUTH ONCE DAILY FOR CHOLESTEROL    citalopram (CELEXA) 20 mg Oral Tablet Take 1 Tablet (20 mg total) by mouth Once a day    clopidogreL (PLAVIX) 75 mg Oral Tablet Take 1 tablet by mouth once daily    cyanocobalamin (VITAMIN B12) 1,000 mcg/mL Injection Solution Inject 1 mL (1,000 mcg total) into the muscle Every 30 days    folic acid 0.8 mg Oral Capsule Take 1 Capsule (0.8 mg total) by mouth Once a day    Levocetirizine (XYZAL) 5 mg Oral Tablet Take 1 Tablet (5 mg total) by mouth Once per day as needed for Allergies    losartan (COZAAR) 25 mg Oral Tablet Take 1 Tablet (25 mg total) by mouth Once a day    nitroGLYCERIN (NITROSTAT) 0.4 mg Sublingual Tablet, Sublingual Place 1 Tablet (0.4 mg total) under the tongue Every 5 minutes as needed for Chest pain for 3 doses over 15 minutes    omeprazole (PRILOSEC) 20 mg Oral Capsule, Delayed Release(E.C.) Take 1 Capsule (20 mg total) by mouth Once a day    traMADoL (ULTRAM) 50 mg Oral Tablet Take 1 Tablet (50 mg total) by mouth Twice per day as needed for Pain     Family History:  Family Medical History:       Problem Relation (Age of Onset)    Cancer Mother    Coronary Artery Disease Father            Social History:  Social History     Socioeconomic History    Marital status: Divorced   Occupational History    Occupation: disabled d/t back   Tobacco Use    Smoking status: Former     Types: Cigarettes    Smokeless tobacco: Current    Tobacco comments:     Nov 2024: 1 can every 2-3 days x 5 months     Quit  smoking Dec 2023.   Vaping Use    Vaping status: Never Used   Substance and Sexual Activity    Alcohol use: Yes     Comment: rare social use only    Drug use: Never           Physical Exam:  Vital Signs:  Vitals:    05/01/23 0923   BP: 114/60   Pulse: 75   Temp: 36.3 C (97.4 F)   TempSrc: Temporal   SpO2:  95%   Weight: 99.3 kg (219 lb)   Height: 1.784 m (5' 10.25")   BMI: 31.27     Physical Exam  Vitals and nursing note reviewed.   Constitutional:       General: He is not in acute distress.     Appearance: He is normal weight.   HENT:      Head: Normocephalic.   Eyes:      General: Lids are normal. No scleral icterus.  Neck:      Vascular: No carotid bruit.   Cardiovascular:      Rate and Rhythm: Normal rate and regular rhythm.      Pulses: Normal pulses.      Heart sounds: Normal heart sounds. No murmur heard.  Pulmonary:      Effort: Pulmonary effort is normal. No accessory muscle usage or respiratory distress.      Breath sounds: Normal breath sounds. No wheezing, rhonchi or rales.   Abdominal:      General: Bowel sounds are normal.      Palpations: Abdomen is soft.      Tenderness: There is no abdominal tenderness. There is no guarding.   Musculoskeletal:      Right lower leg: No edema.      Left lower leg: No edema.   Skin:     General: Skin is warm and dry.      Coloration: Skin is not jaundiced.   Neurological:      General: No focal deficit present.      Mental Status: He is alert.   Psychiatric:         Attention and Perception: Attention normal.         Mood and Affect: Mood and affect normal.         Speech: Speech normal.         Behavior: Behavior normal. Behavior is cooperative.          Assessment:    ICD-10-CM    1. Coronary artery disease involving native coronary artery of native heart with angina pectoris (CMS HCC)  I25.119       2. Ischemic cardiomyopathy  I25.5       3. Hx of CABG  Z95.1       4. History of heart artery stent  Z95.5       5. Essential (primary) hypertension  I10       6. CKD  (chronic kidney disease) stage 3, GFR 30-59 ml/min (CMS HCC)  N18.30 VITAMIN B12      7. GAD (generalized anxiety disorder)  F41.1       8. Anxiety disorder, unspecified type  F41.9       9. Other insomnia  G47.09       10. Gastroesophageal reflux disease, unspecified whether esophagitis present  K21.9       11. Chronic prostatitis  N41.1       12. High vitamin D level  E67.3       13. Chronic low back pain, unspecified back pain laterality, unspecified whether sciatica present  M54.50     G89.29       14. Spinal stenosis in cervical region  M48.02       15. Vitamin B12 deficiency  E53.8 VITAMIN B12           Plan:  Orders Placed This Encounter    VITAMIN B12    CBC WITH DIFF    losartan (COZAAR) 25 mg Oral Tablet  ALPRAZolam (XANAX) 1 mg Oral Tablet    citalopram (CELEXA) 20 mg Oral Tablet    cyanocobalamin (VITAMIN B12) 1,000 mcg/mL Injection Solution     Chronic medical conditions reviewed and updated per HPI.  Denies new concerns.  Reviewed previous labs and additional ordered.  CAD/cardiomyopathy/history of CABG/history of stent:  No acute symptoms.  Denies chest pain or palpitations.  Reports shortness of breath only if he is bent over.    Hypertension: BP controlled to goal.  BP elevated at Cardiology and losartan was increased to 50 mg daily.  Reports he took times 2-3 days and felt bad so resumed the 25 mg.  Chronic kidney disease: No acute symptoms.  Routine labs.    GAD: Symptoms are stable with current medication, continue.    Insomnia:  Admits to over thinking at night.  May not fall asleep until 4-6 a.m..  He will sleep till 12-2.  Low-dose amitriptyline was ineffective.  We will resume and titrate up.    Gastroesophageal reflux disease:  Denies heartburn, nausea, or vomiting.  Controlled with current medication.    Chronic prostatitis: Denies acute symptoms.  Referral to urology is pending he had to reschedule several times.    History of elevated vitamin-D and height loss.  Order for BMD.     Chronic back pain: Symptoms are stable.  No recent use of tramadol.  He would not want additional intervention at this time so declines MRI.  Has trouble standing for activity such as shaving or washing the dishes.  Mowing grass used to take 1 hour and now takes 3 due to rest periods.    Vitamin B12 deficiency: Well-controlled with current supplement.    Tobacco use: Stopped smoking in December but is using smokeless tobacco.  He has had 15 lb weight gain in the last 4 months. Continues to decline LDCT screening.         Return in about 4 months (around 08/29/2023) for cdm.    Elliot Gurney, FNP-BC     Portions of this note may be dictated using voice recognition software or a dictation service. Variances in spelling and vocabulary are possible and unintentional. Not all errors are caught/corrected. Please notify the [facility] Parkin if any discrepancies are noted or if the meaning of any statement is not clear.

## 2023-05-01 NOTE — Progress Notes (Signed)
FAMILY MEDICINE, MEDICAL OFFICE BUILDING  [address]  [place] [address]    Medicare Annual Wellness Visit    Name: Gabriel Best. MRN:  [id number]   Date: 05/01/2023 Age: 62 y.o.       SUBJECTIVE:   Salathiel D Vlachos Montez Hageman. is a 62 y.o. male for presenting for Medicare Wellness exam.   I have reviewed and reconciled the medication list with the patient today.        05/01/2023     9:09 AM 04/27/2022     9:20 AM   Comprehensive Health Assessment-Adult   Do you wish to complete this form? Yes Yes   During the past 4 weeks, how would you rate your health in general? Good Very Good   During the past 4 weeks, how much difficulty have you had doing your usual activities inside and outside your home because of medical or emotional problems? No difficulty at all No difficulty at all   During the past 4 weeks, was someone available to help you if you needed and wanted help? Yes, some Yes, as much as I wanted   In the past year, how many times have you gone to the emergency department or been admitted to a hospital for a health problem? None 5 times or more   Are you generally satisfied with your sleep? No No   Do you have enough money to buy things you need in everyday life, such as food, clothing, medicines, and housing? Yes, always Yes, always   Can you get to places beyond walking distance without help?  (For example, can you drive your own car or travel alone on buses)? Yes Yes   Do you fasten your seatbelt when you are in a car? Yes, usually Yes, sometimes   Do you exercise 20 minutes 3 or more days per week (such as walking, dancing, biking, mowing grass, swimming)? Yes, some of the time Yes, most of the time   How often do you eat food that is healthy (fruits, vegetables, lean meats) instead of unhealthy (sweets, fast food, junk food, fatty foods)? Most of the time Almost always   Have your parents, brothers or sisters had any of the following problems before the age of 64? (check all that apply) Heart  problems, or hardening of the arteries;Cancer;High cholesterol Heart problems, or hardening of the arteries;High cholesterol;Cancer;Alcohol or drug addiction (or abuse)   How often do you have trouble taking medicines the eay you are told to take them? I always take them as prescribed I always take them as prescribed   Do you need any help communicating with your doctors and nurses because of vision or hearing problems? No No   During the past 12 months, have you experienced confusion or memory loss that is happening more often or is getting worse? No No   Do you have one person you think of as your personal doctor (primary care provider or family doctor)? Yes Yes   If you are seeing a Primary Care Provider (PCP) or family doctor. please list their name [place]   Are you now also seeing any specialist physician(s) (such as eye doctor, foot doctor, skin doctor)? Yes Yes   If you are seeing a specialist for anything such as foot, eye, skin, etc.  please list their name(s) Cardiologist Cardiologist   How confident are you that you can control or manage most of your health problems? Very confident Somewhat confident  I have reviewed and updated as appropriate the past medical, family and social history. 05/01/2023 as summarized below:  Past Medical History:   Diagnosis Date    Anxiety and depression     Atherosclerotic heart disease of native coronary artery without angina pectoris     Chronic back pain     Chronic prostatitis     Urologist Dr Corwin Levins in the past    CKD (chronic kidney disease) stage 3, GFR 30-59 ml/min (CMS HCC)     Coronary disease     Essential hypertension     GERD (gastroesophageal reflux disease)     High vitamin D level     Hyperlipidemia     Renal insufficiency     Tobacco abuse     Vitamin B12 deficiency      Past Surgical History:   Procedure Laterality Date    Cardiac catheterization  06/19/2012    Hx coronary artery bypass graft      Hx knee surgery Bilateral     Hx lap  cholecystectomy      Neck surgery       Current Outpatient Medications   Medication Sig    albuterol sulfate (PROVENTIL OR VENTOLIN OR PROAIR) 90 mcg/actuation Inhalation oral inhaler Take 1-2 Puffs by inhalation Every 6 hours as needed    ALPRAZolam (XANAX) 1 mg Oral Tablet Take 1 Tablet (1 mg total) by mouth Twice per day as needed    amitriptyline (ELAVIL) 10 mg Oral Tablet Take 1 Tablet (10 mg total) by mouth Every night    aspirin (ECOTRIN) 81 mg Oral Tablet, Delayed Release (E.C.) Take 1 Tablet (81 mg total) by mouth Once a day    atorvastatin (LIPITOR) 40 mg Oral Tablet TAKE 1 TABLET BY MOUTH ONCE DAILY FOR CHOLESTEROL    citalopram (CELEXA) 20 mg Oral Tablet Take 1 tablet by mouth once daily    clopidogreL (PLAVIX) 75 mg Oral Tablet Take 1 tablet by mouth once daily    cyanocobalamin (VITAMIN B12) 1,000 mcg/mL Injection Solution INJECT 1 ML (CC) INTRAMUSCULARLY ONCE EVERY MONTH (EVERY 30 DAYS)    folic acid 0.8 mg Oral Capsule Take 1 Capsule (0.8 mg total) by mouth Once a day    Levocetirizine (XYZAL) 5 mg Oral Tablet Take 1 Tablet (5 mg total) by mouth Once per day as needed for Allergies    losartan (COZAAR) 25 mg Oral Tablet Take 1 Tablet (25 mg total) by mouth Once a day    nitroGLYCERIN (NITROSTAT) 0.4 mg Sublingual Tablet, Sublingual Place 1 Tablet (0.4 mg total) under the tongue Every 5 minutes as needed for Chest pain for 3 doses over 15 minutes    omeprazole (PRILOSEC) 20 mg Oral Capsule, Delayed Release(E.C.) Take 1 Capsule (20 mg total) by mouth Once a day    traMADoL (ULTRAM) 50 mg Oral Tablet Take 1 Tablet (50 mg total) by mouth Twice per day as needed for Pain     Family Medical History:       Problem Relation (Age of Onset)    Cancer Mother    Coronary Artery Disease Father            Social History     Socioeconomic History    Marital status: Divorced   Occupational History    Occupation: disabled d/t back   Tobacco Use    Smoking status: Former     Types: Cigarettes    Smokeless tobacco:  Current    Tobacco comments:  Nov 2024: 1 can every 2-3 days x 5 months     Quit smoking Dec 2023.   Vaping Use    Vaping status: Never Used   Substance and Sexual Activity    Alcohol use: Yes     Comment: rare social use only    Drug use: Never     Social Determinants of Health     Health Literacy: Low Risk  (05/01/2023)    Health Literacy     SDOH Health Literacy: Never         List of Current Health Care Providers   Care Team       PCP       Name Type Specialty Phone Number    Elliot Gurney, FNP-BC Nurse Practitioner NURSE PRACTITIONER [phone]              Care Team       No care team found                      Health Maintenance   Topic Date Due    Adult Tdap-Td (1 - Tdap) Never done    Shingles Vaccine (1 of 2) Never done    Covid-19 Vaccine (3 - 2024-25 season) 02/18/2023    Prostate Cancer Screening  02/01/2024    Depression Screening  04/30/2024    Medicare Annual Wellness Visit  04/30/2024    Colonoscopy  04/20/2031    Influenza Vaccine  Completed    Hepatitis C screening  Discontinued    HIV Screening  Discontinued     Medicare Wellness Assessment   Medicare initial or wellness physical in the last year?: Yes; 04/27/22  Advance Directives   Does patient have a living will or MPOA: Yes   Has patient provided [employer] with a copy?: Yes              Activities of Daily Living   Do you need help with dressing, bathing, or walking?: No   Do you need help with shopping, housekeeping, medications, or finances?: No   Do you have rugs in hallways, broken steps, or poor lighting?: Yes (rugs)   Do you have grab bars in your bathroom, non-slip strips in your tub, and hand rails on your stairs?: Yes   Cognitive Function Screen (1=Yes, 0=No)   What is you age?: Correct   What is the time to the nearest hour?: Correct   What is the year?: Correct   What is the name of this clinic?: Correct   Can the patient recognize two persons (the doctor, the nurse, home help, etc.)?: Correct   What is the date of your  birth? (day and month sufficient) : Correct   In what year did World War II end?: Incorrect   Who is the current president of the [place]?: Correct   Count from 20 down to 1?: Correct   What address did I give you earlier?: Correct   Total Score: 9   Interpretation of Total Score: Greater than 6 Normal   Fall Risk Screen   Do you feel unsteady when standing or walking?: No  Do you worry about falling?: No  Have you fallen in the past year?: Yes  How many times have you fallen?: Once  Were you ever injured from falling?: No  Timed up and go test (in seconds): 2   Depression Screen     Little interest or pleasure in doing things.: Not at all  Feeling down, depressed, or hopeless: Not at all  PHQ 2 Total: 0  Trouble falling or staying asleep, or sleeping too much.: Nearly every day  Feeling tired or having little energy: Nearly every day  Poor appetite or overeating: Not at all  Feeling bad about yourself/ that you are a failure in the past 2 weeks?: Not at all  Trouble concentrating on things in the past 2 weeks?: Not at all  Moving/Speaking slowly or being fidgety or restless  in the past 2 weeks?: Not at all  Thoughts that you would be better off DEAD, or of hurting yourself in some way.: Not at all  PHQ 9 Total: 6  Interpretation of Total Score: 5-9 Mild depression     Pain Score   Pain Score:   0 - No pain    Substance Use-Abuse Screening     Tobacco Use     In Past 12 MONTHS, how often have you used any tobacco product (for example, cigarettes, e-cigarettes, cigars, pipes, or smokeless tobacco)?: Daily  In the PAST 3 MONTHS, did you smoke a cigarette containing tobacco or use any other nicotine delivery product (i.e., e-cigarette, vaping or chewing tobacco)?: Yes  In the PAST 3 MONTHS, did you usually smoke more than 10 cigarettes, vape, use an e-cigarette or chew tobacco more than 10 times each day?: No  In the PAST 3 MONTHS, did you usually smoke/use an e-cigarette, vape or chew tobacco within 30 minutes  after waking?: No     Alcohol use     In the PAST 12 MONTHS, how often have you had 5 (men)/4 (women) or more drinks containing alcohol in one day?: Never     Prescription Drug Use     In the PAST 12 months, how often have you used any prescription medications just for the feeling, more than prescribed, or that were not prescribed for you? Prescriptions may include: opioids, benzodiazepines, medications for ADHD: Never           Illicit Drug Use   In the PAST 12 MONTHS, how often have you used any drugs, including marijuana, cocaine or crack, heroin, methamphetamine, hallucinogens, ecstasy/MDMA?: Never        Hearing Screen   Have you noticed any hearing difficulties?: No  After whispering 9-1-6 how many numbers did the patient repeat correctly?: 3       Vision Screen                   OBJECTIVE:   BP 114/60   Pulse 75   Temp 36.3 C (97.4 F) (Temporal)   Ht 1.784 m (5' 10.25")   Wt 99.3 kg (219 lb)   SpO2 95%   BMI 31.20 kg/m        Other appropriate exam:    Physical Exam  Vitals and nursing note reviewed.   Constitutional:       General: He is not in acute distress.     Appearance: He is normal weight.   HENT:      Head: Normocephalic.      Right Ear: Tympanic membrane, ear canal and external ear normal.      Left Ear: Tympanic membrane, ear canal and external ear normal.      Nose: Nose normal.      Mouth/Throat:      Mouth: Mucous membranes are moist.      Pharynx: No pharyngeal swelling or posterior oropharyngeal erythema.   Eyes:  General: Lids are normal. No scleral icterus.     Conjunctiva/sclera: Conjunctivae normal.      Pupils: Pupils are equal, round, and reactive to light.   Neck:      Vascular: No carotid bruit.   Cardiovascular:      Rate and Rhythm: Normal rate and regular rhythm.      Pulses: Normal pulses.      Heart sounds: Normal heart sounds. No murmur heard.  Pulmonary:      Effort: Pulmonary effort is normal. No accessory muscle usage or respiratory distress.      Breath sounds:  Normal breath sounds. No wheezing, rhonchi or rales.   Abdominal:      General: Bowel sounds are normal.      Palpations: Abdomen is soft.      Tenderness: There is no abdominal tenderness. There is no guarding.   Musculoskeletal:      Right lower leg: No edema.      Left lower leg: No edema.   Lymphadenopathy:      Cervical: No cervical adenopathy.      Upper Body:      Right upper body: No supraclavicular adenopathy.      Left upper body: No supraclavicular adenopathy.   Skin:     General: Skin is warm and dry.      Coloration: Skin is not jaundiced.   Neurological:      General: No focal deficit present.      Mental Status: He is alert.   Psychiatric:         Attention and Perception: Attention normal.         Mood and Affect: Mood and affect normal.         Speech: Speech normal.         Behavior: Behavior normal. Behavior is cooperative.          Health Maintenance Due   Topic Date Due    Adult Tdap-Td (1 - Tdap) Never done    Shingles Vaccine (1 of 2) Never done    Covid-19 Vaccine (3 - 2024-25 season) 02/18/2023      ASSESSMENT & PLAN:   Assessment/Plan   1. Medicare annual wellness visit, subsequent    2. Coronary artery disease involving native coronary artery of native heart with angina pectoris (CMS HCC)    3. Hx of CABG    4. CKD (chronic kidney disease) stage 3, GFR 30-59 ml/min (CMS HCC)    5. Chronic low back pain, unspecified back pain laterality, unspecified whether sciatica present    6. Chronic prostatitis    7. Ischemic cardiomyopathy    8. Essential (primary) hypertension    9. Gastroesophageal reflux disease, unspecified whether esophagitis present    10. Anxiety disorder, unspecified type    11. High vitamin D level    12. Height loss       Identified Risk Factors/ Recommended Actions       Depression screening is positive. Follow up plan of care:  reports symptoms are stable. Will restart amitriptyline and titrate for insomnia.       Opioid use plan of care:         Opioids use: Plan:  Assessment of pain completed and pain controlled          Orders Placed This Encounter    DEXA BONE DENSITOMETRY    Flu Vaccine,6 month-adult,0.5 mL IM (Admin)    CBC/DIFF    BASIC METABOLIC PANEL    HEPATIC  FUNCTION PANEL    LIPID PANEL    THYROID STIMULATING HORMONE (SENSITIVE TSH)    VITAMIN D 25 TOTAL    MAGNESIUM    PSA SCREENING          General medical exam.   20 lb wt gain this year. Quit smoking. Admits to increase intake of candy.   Denies new concerns.  No known family h/o breast, colon, or prostate cancer.   Prostate cancer screening: annual PSA ordered. Denies acute symptoms.   Colon cancer screening: Reports colonoscopy winter 2022.  Have requested report.  Denies acute symptoms.  LDCT screening: again declines   Osteoporosis screening: 1 3/4 " ht loss documented. H/o high vitamin D. Order for BMD.   Had ECG at cardiologist Aug 2024.   Immunizations reviewed. Advised Shingrix and Tdap. Consider covid.     The patient has been educated about risk factors and recommended preventive care. Written Prevention Plan completed/ updated and given to patient (see After Visit Summary).    Return in about 4 months (around 08/29/2023) for cdm.    Elliot Gurney, FNP-BC

## 2023-05-01 NOTE — Nursing Note (Signed)
05/01/23 0908   Recent Weight Change   Have you had a recent unexplained weight loss or gain? N   Health Education and Literacy   How often do you have a problem understanding what is told to you about your medical condition?  Never   Domestic Violence   Because we are aware of abuse and domestic violence today, we ask all patients: Are you being hurt, hit, or frightened by anyone at your home or in your life?  N   Basic Needs   Do you have any basic needs within your home that are not being met? (such as Food, Shelter, [employer] fast streamer, Tranportation, paying for bills and/or medications) N   Advanced Directives   Do you have any advanced directives? Living Will & MPOA   Do you have the Advanced Directive(s) so we can scan them to your chart? Jeannie Fend

## 2023-05-02 ENCOUNTER — Encounter (INDEPENDENT_AMBULATORY_CARE_PROVIDER_SITE_OTHER): Payer: Self-pay | Admitting: Family

## 2023-05-02 ENCOUNTER — Ambulatory Visit (INDEPENDENT_AMBULATORY_CARE_PROVIDER_SITE_OTHER): Payer: Self-pay | Admitting: Physician Assistant

## 2023-05-07 ENCOUNTER — Other Ambulatory Visit (INDEPENDENT_AMBULATORY_CARE_PROVIDER_SITE_OTHER): Payer: Self-pay | Admitting: Family

## 2023-05-16 ENCOUNTER — Encounter (INDEPENDENT_AMBULATORY_CARE_PROVIDER_SITE_OTHER): Payer: Self-pay | Admitting: Physician Assistant

## 2023-05-16 ENCOUNTER — Other Ambulatory Visit: Payer: Self-pay

## 2023-05-16 ENCOUNTER — Ambulatory Visit (INDEPENDENT_AMBULATORY_CARE_PROVIDER_SITE_OTHER): Payer: Medicare Other | Admitting: Physician Assistant

## 2023-05-16 VITALS — BP 142/76 | HR 109 | Ht 70.25 in | Wt 219.4 lb

## 2023-05-16 DIAGNOSIS — R3911 Hesitancy of micturition: Secondary | ICD-10-CM

## 2023-05-16 DIAGNOSIS — N411 Chronic prostatitis: Secondary | ICD-10-CM

## 2023-05-16 DIAGNOSIS — Z125 Encounter for screening for malignant neoplasm of prostate: Secondary | ICD-10-CM

## 2023-05-16 DIAGNOSIS — R351 Nocturia: Secondary | ICD-10-CM

## 2023-05-16 DIAGNOSIS — N400 Enlarged prostate without lower urinary tract symptoms: Secondary | ICD-10-CM

## 2023-05-16 MED ORDER — FINASTERIDE 5 MG TABLET
5.0000 mg | ORAL_TABLET | Freq: Every day | ORAL | 3 refills | Status: DC
Start: 2023-05-16 — End: 2024-02-15

## 2023-05-16 NOTE — Progress Notes (Signed)
UROLOGY, [facility]  [address] [place] [address]    Progress Note    Name: Gabriel Best. MRN:  [id number]   Date: 05/16/2023 DOB:  May 17, 1961 (62 y.o.)             Chief Complaint: New Patient and Prostatitis  Subjective   Subjective:   Gabriel Best. is a pleasant 62 y.o. White male whom presents to the clinic for prostatitis.  Patient recently completed 2 week course of Bactrim b.i.d. Patient reports several episodes of prostatitis over the last 10 years or so which resolve with 2-3 week course of antibiotics. Patient with moderate urine stream with mild hesitancy. Nocturia x 2. Patient denies personal or family history of urinary malignancy.Patient reports occupational chemical exposure.Patient denies 30 pack year history of tobacco use; quit 1 year ago. Patient denies fevers, chills, nausea, vomiting, hematuria, dysuria, flank pain, incontinence, dribbling, hesitancy, suprapubic pain, headaches, vision changes, shortness of breath, chest pain.     PROSTATE SPECIFIC ANTIGEN   Lab Results   Component Value Date/Time    PROSSPECAG 0.29 05/01/2023 10:09 AM    PROSSPECAG 0.28 01/31/2022 08:07 AM                Objective   Objective :  BP (!) 142/76 (Site: Right Arm, Patient Position: Sitting, Cuff Size: Adult)   Pulse (!) 109   Ht 1.784 m (5' 10.25")   Wt 99.5 kg (219 lb 6.4 oz)   BMI 31.26 kg/m       Gen: NAD, alert  Pulm: unlabored at rest  CV: palpable pulses  Abd: soft, Nt/ND  GU: no suprapubic tenderness, no CVAT    Data reviewed:    Current Outpatient Medications   Medication Sig    albuterol sulfate (PROVENTIL OR VENTOLIN OR PROAIR) 90 mcg/actuation Inhalation oral inhaler Take 1-2 Puffs by inhalation Every 6 hours as needed    ALPRAZolam (XANAX) 1 mg Oral Tablet Take 1 Tablet (1 mg total) by mouth Twice per day as needed    amitriptyline (ELAVIL) 10 mg Oral Tablet Take 1 Tablet (10 mg total) by mouth Every night for 7 days, THEN 2 Tablets (20 mg total) Every night for  7 days, THEN 3 Tablets (30 mg total) Every night for 14 days. CALL OFFICE WITH RESULTS.    aspirin (ECOTRIN) 81 mg Oral Tablet, Delayed Release (E.C.) Take 1 Tablet (81 mg total) by mouth Once a day    atorvastatin (LIPITOR) 40 mg Oral Tablet TAKE 1 TABLET BY MOUTH ONCE DAILY FOR CHOLESTEROL    citalopram (CELEXA) 20 mg Oral Tablet Take 1 Tablet (20 mg total) by mouth Once a day    clopidogreL (PLAVIX) 75 mg Oral Tablet Take 1 tablet by mouth once daily    cyanocobalamin (VITAMIN B12) 1,000 mcg/mL Injection Solution Inject 1 mL (1,000 mcg total) into the muscle Every 30 days    folic acid 0.8 mg Oral Capsule Take 1 Capsule (0.8 mg total) by mouth Once a day    Levocetirizine (XYZAL) 5 mg Oral Tablet Take 1 Tablet (5 mg total) by mouth Once per day as needed for Allergies    losartan (COZAAR) 25 mg Oral Tablet Take 1 Tablet (25 mg total) by mouth Once a day    nitroGLYCERIN (NITROSTAT) 0.4 mg Sublingual Tablet, Sublingual Place 1 Tablet (0.4 mg total) under the tongue Every 5 minutes as needed for Chest pain for 3 doses over 15 minutes  omeprazole (PRILOSEC) 20 mg Oral Capsule, Delayed Release(E.C.) Take 1 Capsule (20 mg total) by mouth Once a day    traMADoL (ULTRAM) 50 mg Oral Tablet Take 1 Tablet (50 mg total) by mouth Twice per day as needed for Pain        Assessment/Plan  Problem List Items Addressed This Visit    None    BPH/LUTS  Discussed the differential diagnosis, pathophysiology and nature of benign prostate enlargement causing lower urinary tract symptoms  Counseled patient on conservative management options including appropriate fluid management, avoidance of diuretics including caffeine and alcohol  Additionally, we discussed the role of pharmacotherapy, including risks, benefits and alternatives:  Alpha-blocker therapy [e.g. Tamsulosin] - potential risks of dizziness, asthenia, orthostasis, and retrograde ejaculation  5-Alpha Reductase Inhibitors [e.g. Finasteride] - potential risks of painful  breast enlargement, diminished libido or erectile dysfunction, ejaculatory dysfunction, and potential concerns for increased risk of high grade prostate cancer and delayed time improved symptoms      Suspected chronic prostatitis  I discussed the differential diagnosis, pathophysiology and nature of patient's clinically-suspected chronic prostatitis/chronic pelvic pain syndrome  Patient was counseled on available therapeutic options in the management of chronic pelvic pain syndrome  Prolonged course (at least 4-6 weeks) antimicrobial therapy [generally a fluroquinolone or sulfa-derivative with adequate prostatic drug concentration] if pain-predominant symptoms  Alpha-blocker therapy (e.g. Tamsulosin) if voiding-predominant symptoms  Anti-inflammatory/anti-oxidant agents including non-steroidal antiinflammatories (NSAIDS), Quercetin (a bioflavonoid supplement) and/or Finasteride (Proscar)  Daily warm sitz baths  Avoidance of foods that may trigger symptoms such as caffeine, spicy foods and/or alcohol  Stress management  We also discussed the role of further diagnostic modalities, including cystoscopy, scrotal US, CT abd/pel to aid in the diagnosis and rule out alternative diagnoses.  Prescription provided for Finasteride (ProscarT) 5 mg P.O. daily:    I have discussed in great detail with the patient the treatment of prostate enlargement using finasteride.  I have explained my rationale for using finasteride as well as potential long-term risks of sexual dysfunction, including impotence (5.1%), decreased libido (2.6%), and decreased ejaculate volume (1.5%). I also explained the possibility of breast enlargement (1.8%) and/or tenderness (0.7%).  I did warn patient about the possibility of hypersensitivity reactions leading to rash, itching, hives, or swelling (0.5%).  He was instructed to call the office if any of these or any other possible reactions occurred.  I further advised patient that finasteride may cause an  alteration in his PSA level and the FDA caution of a possible "increase in the risk of high-grade prostate cancer".  The patient expressed an understanding of the treatment, possible reactions, and possible prognosis.  Complete current course of bactrim BID for additional 4 weeks.    Prostate Cancer Screening  Based on patient's clinical findings including his recent prostate specific antigen level, age, ethnicity and relevant risk factors and in accordance with the American Urological Association (AUA) & National Comprehensive Cancer Network (NCCN) published screening guidelines, I would recommend the following:  Continued annual prostate specific antigen & digital rectal exam screening with cessation of screening at age 82 or upon developing more serious health issues.  PSA yearly      Inioluwa Baris, PA-C

## 2023-05-20 DIAGNOSIS — M8580 Other specified disorders of bone density and structure, unspecified site: Secondary | ICD-10-CM | POA: Insufficient documentation

## 2023-05-22 ENCOUNTER — Ambulatory Visit (HOSPITAL_COMMUNITY): Payer: Self-pay

## 2023-05-24 ENCOUNTER — Other Ambulatory Visit: Payer: Self-pay

## 2023-05-24 ENCOUNTER — Inpatient Hospital Stay
Admission: RE | Admit: 2023-05-24 | Discharge: 2023-05-24 | Disposition: A | Discharge status: Home / Self Care | Payer: Medicare Other | Source: Ambulatory Visit | Attending: Family

## 2023-05-24 DIAGNOSIS — M85851 Other specified disorders of bone density and structure, right thigh: Secondary | ICD-10-CM

## 2023-05-24 DIAGNOSIS — E673 Hypervitaminosis D: Secondary | ICD-10-CM | POA: Insufficient documentation

## 2023-05-24 DIAGNOSIS — M85852 Other specified disorders of bone density and structure, left thigh: Secondary | ICD-10-CM

## 2023-05-24 DIAGNOSIS — R29890 Loss of height: Secondary | ICD-10-CM | POA: Insufficient documentation

## 2023-05-24 DIAGNOSIS — M8589 Other specified disorders of bone density and structure, multiple sites: Secondary | ICD-10-CM | POA: Insufficient documentation

## 2023-05-25 ENCOUNTER — Encounter (INDEPENDENT_AMBULATORY_CARE_PROVIDER_SITE_OTHER): Payer: Self-pay

## 2023-06-25 ENCOUNTER — Other Ambulatory Visit (INDEPENDENT_AMBULATORY_CARE_PROVIDER_SITE_OTHER): Payer: Self-pay | Admitting: NURSE PRACTITIONER

## 2023-08-16 ENCOUNTER — Other Ambulatory Visit: Payer: Self-pay

## 2023-08-16 ENCOUNTER — Encounter (INDEPENDENT_AMBULATORY_CARE_PROVIDER_SITE_OTHER): Payer: Self-pay | Admitting: INTERVENTIONAL CARDIOLOGY

## 2023-08-16 ENCOUNTER — Ambulatory Visit: Payer: Medicare Other | Attending: INTERVENTIONAL CARDIOLOGY | Admitting: INTERVENTIONAL CARDIOLOGY

## 2023-08-16 VITALS — BP 131/67 | HR 75 | Ht 70.0 in | Wt 216.0 lb

## 2023-08-16 DIAGNOSIS — E785 Hyperlipidemia, unspecified: Secondary | ICD-10-CM | POA: Insufficient documentation

## 2023-08-16 DIAGNOSIS — I255 Ischemic cardiomyopathy: Secondary | ICD-10-CM | POA: Insufficient documentation

## 2023-08-16 DIAGNOSIS — I2510 Atherosclerotic heart disease of native coronary artery without angina pectoris: Secondary | ICD-10-CM | POA: Insufficient documentation

## 2023-08-16 DIAGNOSIS — Z87891 Personal history of nicotine dependence: Secondary | ICD-10-CM

## 2023-08-16 DIAGNOSIS — I10 Essential (primary) hypertension: Secondary | ICD-10-CM | POA: Insufficient documentation

## 2023-08-16 DIAGNOSIS — Z951 Presence of aortocoronary bypass graft: Secondary | ICD-10-CM | POA: Insufficient documentation

## 2023-08-16 LAB — ECG W INTERP (AMB USE ONLY)(MUSE,IN CLINIC)
Atrial Rate: 71 {beats}/min
Calculated P Axis: 48 degrees
Calculated R Axis: 0 degrees
Calculated T Axis: 67 degrees
PR Interval: 158 ms
QRS Duration: 90 ms
QT Interval: 398 ms
QTC Calculation: 432 ms
Ventricular rate: 71 {beats}/min

## 2023-08-16 MED ORDER — TELMISARTAN 40 MG TABLET
40.0000 mg | ORAL_TABLET | Freq: Every day | ORAL | 4 refills | Status: AC
Start: 2023-08-16 — End: ?

## 2023-08-16 MED ORDER — ATORVASTATIN 80 MG TABLET
80.0000 mg | ORAL_TABLET | Freq: Every day | ORAL | 3 refills | Status: AC
Start: 2023-08-16 — End: 2024-08-15

## 2023-08-16 MED ORDER — SPIRONOLACTONE 25 MG TABLET
25.0000 mg | ORAL_TABLET | Freq: Every morning | ORAL | 3 refills | Status: DC
Start: 2023-08-16 — End: 2023-08-16

## 2023-08-16 NOTE — Progress Notes (Addendum)
 [facility]     Best, Gabriel Decamp., 63 y.o. male  Date of Service: 08/16/2023  Date of Birth:  09/03/1960  PCP:  Elliot Gurney, FNP-BC  Chief Complaint   Patient presents with    Follow Up 6 Months     CAD    Hypertension    Hyperlipidemia        HPI:    The patient has a history of coronary artery disease.  He had four-vessel bypass at age 55 after MI performed at [facility].  LV function was preserved.  In 2013, the patient had a non-ST elevation MI and underwent cardiac catheterization by Dr. Bari Edward at [facility] in [place].  He was found to have an occlusion in the vein graft to the left circumflex, which was borderline stenosis 60% to 70%.  There was 75% stenosis in the mid RCA that had a patent vein graft and there was also patent graft to the diagonal.  Stress test in 2018 for preoperative assessment was low risk study.  EF was 64% on resting imaging.  There was no TID.  There was a moderate size apical perfusion defect that was largely fixed consistent with previous defect just slightly extended.  Repeat stress test in May 2022 was a normal, low risk study, normal myocardial perfusion imaging.  EF was slightly reduced to 46%. Echocardiogram in March 2022 showed EF 45% with moderate aortic insufficiency. The patient underwent cath on 11/23/21 which resulted in PCI to the RCA, followed by staged PCI to the PLB and distal RCA on 12/13/21, and then staged PCI to the SVG to the diagonal as well as to the left main/LCX/OM2.  Chest pain did improve with initial PCI to the RCA. Echo 08/17/21 showed EF 45% with moderate AI.      11/03/21 The patient is here due to chest pain.  He states he developed chest pain that woke him up Sunday night.  It started off on the left side but then radiated to the right side and then midsternal.  Symptoms within radiate up through withdrawal similar to reflux.  He used to take Prilosec for reflux but stopped this several years ago.  Sometimes  position change will help with symptoms and he was able to get back to sleep, but then symptoms persisted throughout the day the next day.  He ended up going to the emergency room for evaluation.  His cardiac workup was negative.  His symptoms did improve with a GI cocktail.  He was diagnosed with pneumonia, but chest x-ray showed chronic changes and he had no symptoms related to pneumonia.  He is now on Pepcid and Prilosec, but he continues to have symptoms.     02/14/22 The patient is here for f/u from heart catheterizations, initial cath on 11/23/21 which resulted in PCI to the RCA, staged PCI to the PLB and distal RCA on 12/13/21, and then staged PCI to the SVG to the diagonal as well as to the left main/LCX/OM2.  Chest pain did improve with initial PCI to the RCA.  He states now his shortness of breath is also improving.  He still has some at baseline due to his history of smoking, but he has much more energy now.  He is doing his previous tasks at home that he was unable to do earlier in the spring.  He feels like he is slowly continuing to improve.     08/15/22 The patient is here for  CAD follow-up.  He denies any significant chest pains.  Shortness of breath is still improving.  He is able to quit smoking back in December.  He has gained a little weight since then.  He was little confused on his blood pressure medicine, he was not sure if he was taking lisinopril and amlodipine, but he thinks he is just taking amlodipine.  He states he was switch back and forth and 1 of them was causing leg swelling.      02/13/2023 The patient is here for CAD follow up. He denies any significant chest pains.  Shortness of breath is still improving.  He was able to quit smoking back in December 2023, but now smokes a cigarette at time when he is stressed. The blood pressure is elevated 142/76. He has not been checking BP at home recently. He is tolerating aspirin and Plavix without bleeding. He is having changes with insurance and  his disability. His income is decreasing, He recently lost his car.     08/16/2023 The patient is here for coronary artery disease follow-up.  He denies any chest pain.  He does have dyspnea with heavy exertion, unchanged.  He has stopped smoking.  Home blood pressure readings have been about 118-130.  He is now on Medicare, his prescription costs continue to be challenging however he reports compliance.    EKG: SR 71bpm  LAB: 04/2023  LDL 79, K+ 4.3, Cr 1.58, Hgb 15.6, Plt 147    Past Medical History:   Diagnosis Date    Anxiety and depression     Atherosclerotic heart disease of native coronary artery without angina pectoris     Chronic back pain     Chronic prostatitis     Urologist Dr Corwin Levins in the past    CKD (chronic kidney disease) stage 3, GFR 30-59 ml/min (CMS HCC)     Coronary disease     Essential hypertension     GERD (gastroesophageal reflux disease)     High vitamin D level     Hyperlipidemia     Renal insufficiency     Tobacco abuse     Vitamin B12 deficiency        Past Surgical History:   Procedure Laterality Date    CARDIAC CATHETERIZATION  06/19/2012    HX CORONARY ARTERY BYPASS GRAFT      HX KNEE SURGERY Bilateral     Dr Lindwood Qua    HX LAP CHOLECYSTECTOMY      NECK SURGERY      Vertebraes fused back together       Current Outpatient Medications   Medication Sig    albuterol sulfate (PROVENTIL OR VENTOLIN OR PROAIR) 90 mcg/actuation Inhalation oral inhaler Take 1-2 Puffs by inhalation Every 6 hours as needed    ALPRAZolam (XANAX) 1 mg Oral Tablet Take 1 Tablet (1 mg total) by mouth Twice per day as needed    aspirin (ECOTRIN) 81 mg Oral Tablet, Delayed Release (E.C.) Take 1 Tablet (81 mg total) by mouth Once a day    atorvastatin (LIPITOR) 40 mg Oral Tablet TAKE 1 TABLET BY MOUTH ONCE DAILY FOR CHOLESTEROL    citalopram (CELEXA) 20 mg Oral Tablet Take 1 Tablet (20 mg total) by mouth Once a day    clopidogreL (PLAVIX) 75 mg Oral Tablet Take 1 tablet by mouth once daily    cyanocobalamin (VITAMIN  B12) 1,000 mcg/mL Injection Solution Inject 1 mL (1,000 mcg total) into the muscle Every 30 days  finasteride (PROSCAR) 5 mg Oral Tablet Take 1 Tablet (5 mg total) by mouth Once a day    folic acid 0.8 mg Oral Capsule Take 1 Capsule (0.8 mg total) by mouth Once a day    losartan (COZAAR) 25 mg Oral Tablet Take 1 Tablet (25 mg total) by mouth Once a day    nitroGLYCERIN (NITROSTAT) 0.4 mg Sublingual Tablet, Sublingual Place 1 Tablet (0.4 mg total) under the tongue Every 5 minutes as needed for Chest pain for 3 doses over 15 minutes (Patient not taking: Reported on 08/16/2023)    omeprazole (PRILOSEC) 20 mg Oral Capsule, Delayed Release(E.C.) Take 1 Capsule (20 mg total) by mouth Once a day    traMADoL (ULTRAM) 50 mg Oral Tablet Take 1 Tablet (50 mg total) by mouth Twice per day as needed for Pain     ROS: Other than issues noted in HPI, all other systems were negative.     Exam:  Vitals:    08/16/23 1025   BP: 131/67   Pulse: 75   SpO2: 94%   Weight: 98 kg (216 lb)   Height: 1.778 m (5\' 10" )   BMI: 30.99       General: No acute distress and appears stated age.    Neck: No JVD, no carotid bruit. and supple, symmetrical, trachea midline.   Lungs: Diminished to auscultation bilaterally.    Cardiovascular: Regular rate and rhythm, normal S1 S2, no murmur, no rub, or gallop, no thrill     Abdomen: Soft, non-tender and bowel sounds normal.    Extremities: Extremities normal, atraumatic, no cyanosis or edema.    Skin: Skin warm and dry.    Neurologic: Alert and oriented x3.    Orders placed this visit:  Orders Placed This Encounter    EKG (In-Clinic Today)       Assessment/Plan:  CAD in native artery    Ischemic cardiomyopathy    Essential hypertension    Hyperlipidemia with target low density lipoprotein (LDL) cholesterol less than 70 mg/dL    Hx of CABG    CAD appears stable, he is currently asymptomatic.  He has stopped smoking, LDL remains above target we will titrate his therapy.  Continue Plavix 75 mg daily  Continue  aspirin 81 mg daily  Increase atorvastatin to 80 mg daily, LDL target less than 70  Change losartan 25 mg daily to telmisartan 40 mg daily  labs scheduled with PCP  Update echocardiogram before next visit return to clinic in 6 months    April Schoolcraft, [facility] 08/16/2023 11:13    Interventional Cardiology attending: The patient was seen by me and examined as part of a shared service with the APP. The patient was clinically evaluated, and all pertinent lab results and imaging were reviewed. I reviewed the APP note and made any substantive changes necessary. I agree with the APP note, which reflects my medical decision making. The     Elam Dutch, MD  08/16/2023 11:35

## 2023-08-16 NOTE — Addendum Note (Signed)
 Addended by: Elam Dutch on: 08/16/2023 11:36 AM     Modules accepted: Orders

## 2023-08-17 ENCOUNTER — Encounter (INDEPENDENT_AMBULATORY_CARE_PROVIDER_SITE_OTHER): Payer: Self-pay | Admitting: Physician Assistant

## 2023-08-17 ENCOUNTER — Ambulatory Visit (INDEPENDENT_AMBULATORY_CARE_PROVIDER_SITE_OTHER): Payer: Self-pay | Admitting: Physician Assistant

## 2023-08-17 VITALS — BP 125/71 | HR 92 | Ht 70.0 in | Wt 216.6 lb

## 2023-08-17 DIAGNOSIS — N401 Enlarged prostate with lower urinary tract symptoms: Secondary | ICD-10-CM

## 2023-08-17 DIAGNOSIS — N400 Enlarged prostate without lower urinary tract symptoms: Secondary | ICD-10-CM

## 2023-08-17 DIAGNOSIS — Z87438 Personal history of other diseases of male genital organs: Secondary | ICD-10-CM

## 2023-08-17 DIAGNOSIS — N411 Chronic prostatitis: Secondary | ICD-10-CM

## 2023-08-17 NOTE — Progress Notes (Signed)
 UROLOGY, [facility]  [address] [place] [address]    Progress Note    Name: Gabriel Best. MRN:  [id number]   Date: 08/17/2023 DOB:  07/17/1960 (63 y.o.)             Chief Complaint: Follow Up 3 Months, Benign Prostatic Hypertrophy, and Prostatitis  Subjective   Subjective:   Antonio D Radwan Montez Hageman. is a pleasant 63 y.o. White male whom presents to the clinic for prostatitis.  Patient recently completed 2 week course of Bactrim b.i.d. Patient reports several episodes of prostatitis over the last 10 years or so which resolve with 2-3 week course of antibiotics. Patient with moderate urine stream with mild hesitancy. Nocturia x 2. Patient denies personal or family history of urinary malignancy.Patient reports occupational chemical exposure.Patient denies 30 pack year history of tobacco use; quit 1 year ago. Patient denies fevers, chills, nausea, vomiting, hematuria, dysuria, flank pain, incontinence, dribbling, hesitancy, suprapubic pain, headaches, vision changes, shortness of breath, chest pain.    Today, patient with resolution of pelvic pain symptoms with completion 6 week bactrim course. No complaints. Patient denies fevers, chills, nausea, vomiting, hematuria, dysuria, flank pain, incontinence, dribbling, hesitancy, suprapubic pain, headaches, vision changes, shortness of breath, chest pain.       PROSTATE SPECIFIC ANTIGEN   Lab Results   Component Value Date/Time    PROSSPECAG 0.29 05/01/2023 10:09 AM    PROSSPECAG 0.28 01/31/2022 08:07 AM                Objective   Objective :  BP 125/71 (Site: Left Antecubital;Right Arm, Patient Position: Sitting, Cuff Size: Adult)   Pulse 92   Ht 1.778 m (5\' 10" )   Wt 98.2 kg (216 lb 9.6 oz)   BMI 31.08 kg/m       Gen: NAD, alert  Pulm: unlabored at rest  CV: palpable pulses  Abd: soft, Nt/ND  GU: no suprapubic tenderness, no CVAT    Data reviewed:    Current Outpatient Medications   Medication Sig    albuterol sulfate (PROVENTIL OR VENTOLIN OR  PROAIR) 90 mcg/actuation Inhalation oral inhaler Take 1-2 Puffs by inhalation Every 6 hours as needed    ALPRAZolam (XANAX) 1 mg Oral Tablet Take 1 Tablet (1 mg total) by mouth Twice per day as needed    aspirin (ECOTRIN) 81 mg Oral Tablet, Delayed Release (E.C.) Take 1 Tablet (81 mg total) by mouth Once a day    atorvastatin (LIPITOR) 80 mg Oral Tablet Take 1 Tablet (80 mg total) by mouth Once a day for cholesterol    citalopram (CELEXA) 20 mg Oral Tablet Take 1 Tablet (20 mg total) by mouth Once a day    clopidogreL (PLAVIX) 75 mg Oral Tablet Take 1 tablet by mouth once daily    cyanocobalamin (VITAMIN B12) 1,000 mcg/mL Injection Solution Inject 1 mL (1,000 mcg total) into the muscle Every 30 days    finasteride (PROSCAR) 5 mg Oral Tablet Take 1 Tablet (5 mg total) by mouth Once a day    folic acid 0.8 mg Oral Capsule Take 1 Capsule (0.8 mg total) by mouth Once a day    nitroGLYCERIN (NITROSTAT) 0.4 mg Sublingual Tablet, Sublingual Place 1 Tablet (0.4 mg total) under the tongue Every 5 minutes as needed for Chest pain for 3 doses over 15 minutes (Patient not taking: Reported on 08/16/2023)    omeprazole (PRILOSEC) 20 mg Oral Capsule, Delayed Release(E.C.) Take 1 Capsule (20  mg total) by mouth Once a day    telmisartan (MICARDIS) 40 mg Oral Tablet Take 1 Tablet (40 mg total) by mouth Once a day    traMADoL (ULTRAM) 50 mg Oral Tablet Take 1 Tablet (50 mg total) by mouth Twice per day as needed for Pain        Assessment/Plan  Problem List Items Addressed This Visit    None    BPH/LUTS  Discussed the differential diagnosis, pathophysiology and nature of benign prostate enlargement causing lower urinary tract symptoms  Counseled patient on conservative management options including appropriate fluid management, avoidance of diuretics including caffeine and alcohol  Additionally, we discussed the role of pharmacotherapy, including risks, benefits and alternatives:  Alpha-blocker therapy [e.g. Tamsulosin] - potential  risks of dizziness, asthenia, orthostasis, and retrograde ejaculation  5-Alpha Reductase Inhibitors [e.g. Finasteride] - potential risks of painful breast enlargement, diminished libido or erectile dysfunction, ejaculatory dysfunction, and potential concerns for increased risk of high grade prostate cancer and delayed time improved symptoms      Suspected chronic prostatitis  I discussed the differential diagnosis, pathophysiology and nature of patient's clinically-suspected chronic prostatitis/chronic pelvic pain syndrome  Patient was counseled on available therapeutic options in the management of chronic pelvic pain syndrome  Prolonged course (at least 4-6 weeks) antimicrobial therapy [generally a fluroquinolone or sulfa-derivative with adequate prostatic drug concentration] if pain-predominant symptoms  Alpha-blocker therapy (e.g. Tamsulosin) if voiding-predominant symptoms  Anti-inflammatory/anti-oxidant agents including non-steroidal antiinflammatories (NSAIDS), Quercetin (a bioflavonoid supplement) and/or Finasteride (Proscar)  Daily warm sitz baths  Avoidance of foods that may trigger symptoms such as caffeine, spicy foods and/or alcohol  Stress management  We also discussed the role of further diagnostic modalities, including cystoscopy, scrotal US, CT abd/pel to aid in the diagnosis and rule out alternative diagnoses.  Continue Finasteride (ProscarT) 5 mg P.O. daily:    I have discussed in great detail with the patient the treatment of prostate enlargement using finasteride.  I have explained my rationale for using finasteride as well as potential long-term risks of sexual dysfunction, including impotence (5.1%), decreased libido (2.6%), and decreased ejaculate volume (1.5%). I also explained the possibility of breast enlargement (1.8%) and/or tenderness (0.7%).  I did warn patient about the possibility of hypersensitivity reactions leading to rash, itching, hives, or swelling (0.5%).  He was instructed to  call the office if any of these or any other possible reactions occurred.  I further advised patient that finasteride may cause an alteration in his PSA level and the FDA caution of a possible "increase in the risk of high-grade prostate cancer".  The patient expressed an understanding of the treatment, possible reactions, and possible prognosis.      Prostate Cancer Screening  Based on patient's clinical findings including his recent prostate specific antigen level, age, ethnicity and relevant risk factors and in accordance with the American Urological Association (AUA) & National Comprehensive Cancer Network (NCCN) published screening guidelines, I would recommend the following:  Continued annual prostate specific antigen & digital rectal exam screening with cessation of screening at age 49 or upon developing more serious health issues.  PSA yearly      Pinchos Topel, PA-C

## 2023-08-30 ENCOUNTER — Other Ambulatory Visit: Payer: Self-pay

## 2023-08-30 ENCOUNTER — Encounter (INDEPENDENT_AMBULATORY_CARE_PROVIDER_SITE_OTHER): Payer: Self-pay | Admitting: Family

## 2023-08-30 ENCOUNTER — Ambulatory Visit (INDEPENDENT_AMBULATORY_CARE_PROVIDER_SITE_OTHER): Payer: Self-pay | Admitting: Family

## 2023-08-30 ENCOUNTER — Other Ambulatory Visit: Attending: Family | Admitting: Family

## 2023-08-30 ENCOUNTER — Telehealth (INDEPENDENT_AMBULATORY_CARE_PROVIDER_SITE_OTHER): Payer: Self-pay | Admitting: NURSE PRACTITIONER

## 2023-08-30 ENCOUNTER — Telehealth (INDEPENDENT_AMBULATORY_CARE_PROVIDER_SITE_OTHER): Payer: Self-pay | Admitting: INTERVENTIONAL CARDIOLOGY

## 2023-08-30 VITALS — BP 105/69 | HR 86 | Temp 97.7°F | Ht 70.0 in | Wt 214.0 lb

## 2023-08-30 DIAGNOSIS — N411 Chronic prostatitis: Secondary | ICD-10-CM

## 2023-08-30 DIAGNOSIS — I25119 Atherosclerotic heart disease of native coronary artery with unspecified angina pectoris: Secondary | ICD-10-CM

## 2023-08-30 DIAGNOSIS — E66811 Obesity, class 1: Secondary | ICD-10-CM

## 2023-08-30 DIAGNOSIS — E538 Deficiency of other specified B group vitamins: Secondary | ICD-10-CM

## 2023-08-30 DIAGNOSIS — M545 Low back pain, unspecified: Secondary | ICD-10-CM

## 2023-08-30 DIAGNOSIS — M8580 Other specified disorders of bone density and structure, unspecified site: Secondary | ICD-10-CM

## 2023-08-30 DIAGNOSIS — K219 Gastro-esophageal reflux disease without esophagitis: Secondary | ICD-10-CM

## 2023-08-30 DIAGNOSIS — N1831 Chronic kidney disease, stage 3a: Secondary | ICD-10-CM

## 2023-08-30 DIAGNOSIS — I129 Hypertensive chronic kidney disease with stage 1 through stage 4 chronic kidney disease, or unspecified chronic kidney disease: Secondary | ICD-10-CM

## 2023-08-30 DIAGNOSIS — M4802 Spinal stenosis, cervical region: Secondary | ICD-10-CM

## 2023-08-30 DIAGNOSIS — I10 Essential (primary) hypertension: Secondary | ICD-10-CM

## 2023-08-30 DIAGNOSIS — Z951 Presence of aortocoronary bypass graft: Secondary | ICD-10-CM

## 2023-08-30 DIAGNOSIS — Z955 Presence of coronary angioplasty implant and graft: Secondary | ICD-10-CM

## 2023-08-30 DIAGNOSIS — K649 Unspecified hemorrhoids: Secondary | ICD-10-CM

## 2023-08-30 DIAGNOSIS — F411 Generalized anxiety disorder: Secondary | ICD-10-CM

## 2023-08-30 DIAGNOSIS — G4709 Other insomnia: Secondary | ICD-10-CM

## 2023-08-30 LAB — BASIC METABOLIC PANEL
ANION GAP: 9 mmol/L (ref 4–13)
BUN/CREA RATIO: 11 (ref 6–22)
BUN: 17 mg/dL (ref 7–25)
CALCIUM: 9.2 mg/dL (ref 8.6–10.3)
CHLORIDE: 103 mmol/L (ref 98–107)
CO2 TOTAL: 25 mmol/L (ref 21–31)
CREATININE: 1.52 mg/dL — ABNORMAL HIGH (ref 0.60–1.30)
ESTIMATED GFR: 52 mL/min/{1.73_m2} — ABNORMAL LOW (ref >59)
GLUCOSE: 96 mg/dL (ref 74–109)
OSMOLALITY, CALCULATED: 275 mosm/kg (ref 270–290)
POTASSIUM: 4.2 mmol/L (ref 3.5–5.1)
SODIUM: 137 mmol/L (ref 136–145)

## 2023-08-30 MED ORDER — HYDROCORTISONE ACETATE 10 % (80 MG) RECTAL FOAM
1.0000 | Freq: Two times a day (BID) | RECTAL | 1 refills | Status: DC | PRN
Start: 2023-08-30 — End: 2024-01-01

## 2023-08-30 MED ORDER — AMITRIPTYLINE 10 MG TABLET
ORAL_TABLET | ORAL | 0 refills | Status: DC
Start: 2023-08-30 — End: 2023-10-09

## 2023-08-30 MED ORDER — ALPRAZOLAM 1 MG TABLET
1.0000 mg | ORAL_TABLET | Freq: Two times a day (BID) | ORAL | 3 refills | Status: DC | PRN
Start: 2023-08-30 — End: 2024-01-01
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
## 2023-08-30 NOTE — Progress Notes (Signed)
 [facility], MEDICAL OFFICE BUILDING  [address]  [place] [address]          Name: Gabriel Best. MRN:  [id number]   Date: 08/30/2023 Age: 63 y.o.          Provider: Elliot Gurney, FNP-BC    Reason for visit: Follow Up 4 Months and Labs Only      History of Present Illness:  Gabriel D Eleazer Montez Hageman. is a 63 y.o. male presenting with    Recurrent nasal congestion.   Previously failed multiple nasal sprays including Flonase and Nasonex. No significant change with oral anti-histamine.   Some ongoing cough. CXR Nov 2023.   Med:   Albuterol PRN     Chronic prostatitis  Urology Nov 2024: added Proscar; bactrim for an additional 4 weeks   F/u Feb 2025: completed bactrim 6 week with resolution of pain. Continue Proscar. PSA yearly.      Extensive cardiac history, following with cardiologist.   Cardiac cath July 2023. Chronic DOE.   Has had 3 PCI with total of 9 stents.   ECG at cardio Feb 2024/Aug 2024/Feb 2025  H/o MI and CABG.   H/o blood donation d/t elevated Hgb.   Meds:   NTG PRN- has not had to use since last stent  Aspirin 81 mg daily  Atorvastatin 80 mg daily   Clopidogrel 75 mg daily  Previous Losartan 25 mg daily (failed 50 mg Aug 2024, reports he felt bad), changed Feb 2025  Telmisartan 40 mg daily   No BB d/t bradycardia.   Previous:   Lisinopril 5mg  1/2 tab daily and amlodipine: changed to losartan d/t cardiomyopathy.   Consults: Dr Elesa Massed  Dr Elesa Massed Feb 2025 Continue Plavix, aspirin, increase atorvastatin to 80 mg (goal LDL < 70), change losartan 25 to telmisartan 40 mg daily. Echo before f/u 6 months.      CKD  Onset: May 2023 or before     Anxiety/insomnia  Onset: age 19s  Aug 2023: long-standing use of alprazolam d/t h/o anxiety attacks. He ran out recently during change in PCP and had exacerbation of symptoms. I reviewed risks of withdrawals. BOP consistent, no indication of abuse. Denies SI. No SE of meds. No respiratory depression. He waits at least 1 hour between alprazolam and tramadol  when he uses it.   Meds:   Alprazolam 1 mg BID  Citalopram 20 mg daily  Failed melatonin  Failed trazodone March 2024 (ineffective)   Trial of amitriptyline July 2024 no help so stopped it. Resume with titration Nov 2024. He took initially but stopped. Retry March 2025.  Consults: none      GERD  Investigations:   No EGD that he is aware of  Colonoscopy Winter 2022  Meds:   Previous Famotidine 20 mg daily PRN  Omeprazole 20 mg daily     Vitamin B12 Def  Med:  Monthly injections (self administers)   Folate      Elevated vitamin D  Lab: > 120 Aug 2023; 96 Nov 2023  PTH 72.21 Jan 2022  Nov 2023: Denies supplement.  Nov 2024: ht loss. Order for BMD.     Osteopenia  BMD Dec 2024 LS 0.0 HP -2.3     Chronic back pain/DDD/bil knee pain  Hx: cervical fusion, bil knee surgery   Reports back XR 2022 at [facility]- requested Nov 2024 (did not receive)  March 2024: may use tramadol twice a month, couple of doses each episode. Declines refill  needed.   Med: no longer using tramadol PRN  Was on Lortab previously.   Consults: Dr Lindwood Qua for knee surgery, none since  Dr Laurie Panda for neck/back. Not current.   Does not desire additional work-up/referral March 2025.     Tobacco Use  Nov 2024: did not resume smoking. Smokeless tobacco 1 can q 2-3 days.   March 2024: stopped smoking Dec 2023. Some smokeless tobacco.   Nov 2023: decreased smoking x couple of months, occ dips. Not having a cigarette everyday. Has a dip most days with activity such as walking dog.   Aug 2023: he is motivated to try to quit. Candy helps. He had gotten down to 4-5 cig daily until he ran out of alprazolam and resumed 1 ppd. Verbalized intent to decrease.   Onset: age 64 Quit x 11 years at age 9 after MI. Restarted age 59 during divorce. Avg 1 PPD. Quit Dec 2023. Approx 34 pack year history.   Declined LDCT July 2024, Nov 2024.     A1C: 5.4  A1C Date: 01/31/2022  COMPLETE BLOOD COUNT   Lab Results   Component Value Date    WBC 7.2 05/01/2023    HGB 15.6 05/01/2023    HCT  45.6 05/01/2023    PLTCNT 147 05/01/2023       DIFFERENTIAL  Lab Results   Component Value Date    PMNS 53 05/01/2023    LYMPHOCYTES 34 05/01/2023    MONOCYTES 10 05/01/2023    EOSINOPHIL 3 05/01/2023    BASOPHILS 1 05/01/2023    BASOPHILS 0.00 05/01/2023    PMNABS 3.80 05/01/2023    LYMPHSABS 2.40 05/01/2023    EOSABS 0.20 05/01/2023    MONOSABS 0.70 05/01/2023     BASIC METABOLIC PANEL  Lab Results   Component Value Date    SODIUM 139 05/01/2023    POTASSIUM 4.3 05/01/2023    CHLORIDE 102 05/01/2023    CO2 28 05/01/2023    ANIONGAP 9 05/01/2023    BUN 21 05/01/2023    CREATININE 1.58 (H) 05/01/2023    BUNCRRATIO 13 05/01/2023    GFR 49 (L) 05/01/2023    CALCIUM 9.5 05/01/2023    GLUCOSE Negative 11/03/2022    GLUCOSENF 68 (L) 05/01/2023      LIPID PROFILE  Lab Results   Component Value Date    CHOLESTEROL 137 05/01/2023    HDLCHOL 43 05/01/2023    LDLCHOL 79 05/01/2023    TRIG 75 05/01/2023       LIVER TESTS  Lab Results   Component Value Date    ALBUMIN 4.3 05/01/2023    AST 13 05/01/2023    ALT 9 05/01/2023    ALKPHOS 90 05/01/2023    TOTBILIRUBIN 0.9 05/01/2023    BILIRUBINCON 0.20 (H) 05/01/2023    INR 1.03 01/10/2022     PROSTATE SPECIFIC ANTIGEN  Lab Results   Component Value Date    PROSSPECAG 0.29 05/01/2023       THYROID STIMULATING HORMONE  Lab Results   Component Value Date    TSH 1.985 05/01/2023     VITAMIN B12  Lab Results   Component Value Date    VITB12 460 05/01/2023     VITAMIN D 25-HYDROXY  Lab Results   Component Value Date    VITD 96 04/27/2022        Historical Data    Past Medical History:  Past Medical History:   Diagnosis Date    Anxiety and depression  Atherosclerotic heart disease of native coronary artery without angina pectoris     Chronic back pain     Chronic prostatitis     Urologist Dr Corwin Levins in the past    CKD (chronic kidney disease) stage 3, GFR 30-59 ml/min (CMS HCC)     Coronary disease     Essential hypertension     GERD (gastroesophageal reflux disease)     High vitamin  D level     Hyperlipidemia     Renal insufficiency     Tobacco abuse     Vitamin B12 deficiency          Past Surgical History:  Past Surgical History:   Procedure Laterality Date    CARDIAC CATHETERIZATION  06/19/2012    HX CORONARY ARTERY BYPASS GRAFT      HX KNEE SURGERY Bilateral     Dr Lindwood Qua    HX LAP CHOLECYSTECTOMY      NECK SURGERY      Vertebraes fused back together         Allergies:  No Known Allergies  Medications:  Current Outpatient Medications   Medication Sig    albuterol sulfate (PROVENTIL OR VENTOLIN OR PROAIR) 90 mcg/actuation Inhalation oral inhaler Take 1-2 Puffs by inhalation Every 6 hours as needed    ALPRAZolam (XANAX) 1 mg Oral Tablet Take 1 Tablet (1 mg total) by mouth Twice per day as needed    amitriptyline (ELAVIL) 10 mg Oral Tablet Take 1 Tablet (10 mg total) by mouth Every night for 7 days, THEN 2 Tablets (20 mg total) Every night for 7 days, THEN 3 Tablets (30 mg total) Every night for 14 days. CALL OFFICE WITH RESULTS.    aspirin (ECOTRIN) 81 mg Oral Tablet, Delayed Release (E.C.) Take 1 Tablet (81 mg total) by mouth Once a day    atorvastatin (LIPITOR) 80 mg Oral Tablet Take 1 Tablet (80 mg total) by mouth Once a day for cholesterol    citalopram (CELEXA) 20 mg Oral Tablet Take 1 Tablet (20 mg total) by mouth Once a day    clopidogreL (PLAVIX) 75 mg Oral Tablet Take 1 tablet by mouth once daily    cyanocobalamin (VITAMIN B12) 1,000 mcg/mL Injection Solution Inject 1 mL (1,000 mcg total) into the muscle Every 30 days    finasteride (PROSCAR) 5 mg Oral Tablet Take 1 Tablet (5 mg total) by mouth Once a day    folic acid 0.8 mg Oral Capsule Take 1 Capsule (0.8 mg total) by mouth Once a day    Hydrocortisone Acetate 10 % (80 mg) Rectal Foam Insert 1 Applicator into the rectum Every 12 hours as needed    nitroGLYCERIN (NITROSTAT) 0.4 mg Sublingual Tablet, Sublingual Place 1 Tablet (0.4 mg total) under the tongue Every 5 minutes as needed for Chest pain for 3 doses over 15 minutes  (Patient not taking: Reported on 08/16/2023)    omeprazole (PRILOSEC) 20 mg Oral Capsule, Delayed Release(E.C.) Take 1 Capsule (20 mg total) by mouth Once a day    spironolactone (ALDACTONE) 25 mg Oral Tablet Take 1 Tablet (25 mg total) by mouth Every morning    telmisartan (MICARDIS) 40 mg Oral Tablet Take 1 Tablet (40 mg total) by mouth Once a day     Family History:  Family Medical History:       Problem Relation (Age of Onset)    Cancer Mother    Coronary Artery Disease Father  Social History:  Social History     Socioeconomic History    Marital status: Divorced   Occupational History    Occupation: disabled d/t back   Tobacco Use    Smoking status: Former     Types: Cigarettes    Smokeless tobacco: Current    Tobacco comments:     Nov 2024: 1 can every 2-3 days x 5 months     Quit smoking Dec 2023.   Vaping Use    Vaping status: Never Used   Substance and Sexual Activity    Alcohol use: Yes     Comment: rare social use only    Drug use: Never           Physical Exam:  Vital Signs:  Vitals:    08/30/23 0909   BP: 105/69   Pulse: 86   Temp: 36.5 C (97.7 F)   TempSrc: Temporal   SpO2: 95%   Weight: 97.1 kg (214 lb)   Height: 1.778 m (5\' 10" )   BMI: 30.71     Physical Exam  Vitals and nursing note reviewed.   Constitutional:       General: He is not in acute distress.     Appearance: Normal appearance. He is not ill-appearing.   HENT:      Head: Normocephalic.   Eyes:      General: No scleral icterus.  Neck:      Vascular: No carotid bruit.   Cardiovascular:      Rate and Rhythm: Normal rate and regular rhythm.      Pulses: Normal pulses.      Heart sounds: Normal heart sounds. No murmur heard.  Pulmonary:      Effort: Pulmonary effort is normal. No accessory muscle usage or respiratory distress.      Breath sounds: Examination of the right-upper field reveals wheezing. Examination of the right-lower field reveals wheezing. Wheezing present. No rhonchi or rales.      Comments: Faint inspiratory wheezing    Abdominal:      General: Bowel sounds are normal.      Palpations: Abdomen is soft.      Tenderness: There is no abdominal tenderness. There is no guarding.   Musculoskeletal:      Right lower leg: No edema.      Left lower leg: No edema.   Skin:     General: Skin is warm and dry.      Coloration: Skin is not jaundiced.   Neurological:      General: No focal deficit present.      Mental Status: He is alert.   Psychiatric:         Attention and Perception: Attention normal.         Mood and Affect: Mood and affect normal.         Speech: Speech normal.         Behavior: Behavior normal. Behavior is cooperative.          Assessment:    ICD-10-CM    1. Coronary artery disease involving native coronary artery of native heart with angina pectoris (CMS HCC)  I25.119 BASIC METABOLIC PANEL      2. History of heart artery stent  Z95.5       3. Hx of CABG  Z95.1       4. Stage 3a chronic kidney disease (CMS HCC)  N18.31       5. Essential (primary) hypertension  I10  6. Chronic prostatitis  N41.1       7. GAD (generalized anxiety disorder)  F41.1       8. Other insomnia  G47.09       9. Gastroesophageal reflux disease, unspecified whether esophagitis present  K21.9       10. Vitamin B12 deficiency  E53.8       11. Osteopenia, unspecified location  M85.80       12. Chronic low back pain, unspecified back pain laterality, unspecified whether sciatica present  M54.50     G89.29       13. Spinal stenosis in cervical region  M48.02       14. Hemorrhoids, unspecified hemorrhoid type  K64.9       15. Obesity (BMI 30.0-34.9)  [id number]            Plan:  Orders Placed This Encounter    BASIC METABOLIC PANEL    amitriptyline (ELAVIL) 10 mg Oral Tablet    Hydrocortisone Acetate 10 % (80 mg) Rectal Foam    ALPRAZolam (XANAX) 1 mg Oral Tablet     Chronic medical conditions reviewed and updated per HPI.    Reviewed previous labs and additional ordered.    CAD/history of CABG/history of stent: No acute symptoms.  Routine follow up with  Cardiology.  There is some question of a prescription for spironolactone.  He received prescription and started taking it but no mention of it in the note.  Note sent to ordering provider for clarification.  He has NTG but  has not needed since before stent.   Chronic kidney disease: No acute symptoms.  Lab has been stable. Repeat lab to reassess.   Hypertension: BP well-controlled to goal with current medication, continue.    Chronic prostatitis: Treated with relief of symptoms.  No acute symptoms.    GAD/insomnia: Symptoms are stable.  Takes alprazolam BID. He initially took the amitriptyline but was afraid if he took 2 tablets that he would sleep too long.  It helped the initial insomnia. He admits to being unable to turn mind off at night.  Willing to retry.    Gastroesophageal reflux disease: No acute symptoms.  Denies heartburn.    Vitamin B12 deficiency: Well-controlled with supplement.    Osteopenia: Reviewed fall risk precautions.    Chronic back/neck pain: Symptoms are stable.  He does not desire additional workup or intervention.  No longer taking tramadol as it was not very effective for him.  Hemorrhoids:  Intermittent exacerbation of symptoms with history of reported thrombosed hemorrhoids.  Acute treatment as directed.  Obesity: 5 lb wt loss in 4 months. Reports decreased intake.   Wheezing: stable. Uses inhaler PRN only, not often.     On the day of the encounter, a total of 40 minutes was spent on this patient encounter including review of historical information, examination, documentation and post-visit activities. The time documented excludes procedural time.     Return in about 4 months (around 12/30/2023) for cdm.    Elliot Gurney, FNP-BC     Portions of this note may be dictated using voice recognition software or a dictation service. Variances in spelling and vocabulary are possible and unintentional. Not all errors are caught/corrected. Please notify the [facility] Parkin if any discrepancies are noted  or if the meaning of any statement is not clear.

## 2023-09-21 ENCOUNTER — Telehealth (INDEPENDENT_AMBULATORY_CARE_PROVIDER_SITE_OTHER): Payer: Self-pay | Admitting: Family

## 2023-09-21 NOTE — Telephone Encounter (Signed)
 Patient informed. Voices understanding and agreement and confirmed that he still has supply of medication to continue.

## 2023-09-21 NOTE — Telephone Encounter (Signed)
-----   Message from Gladys Lamp, FNP-BC sent at 09/21/2023 11:42 AM EDT -----  Regarding: FW: medication  Please telephone and advise: We just heard back from April at cardio office and she advised he should be taking the spironolactone .  ----- Message -----  From: Reyne Cave, FNP-C  Sent: 09/21/2023   9:07 AM EDT  To: Gladys Lamp, FNP-BC  Subject: RE: medication                                   Mylinda Asa am not sure why this medication was not discussed in his treatment plan he does have ischemic cardiomyopathy, Aldactone  is appropriate if blood pressure parameters allow and there is no hyperkalemia/ or renal impairment  ----- Message -----  From: Gladys Lamp, FNP-BC  Sent: 08/30/2023   9:24 AM EDT  To: April Christ, FNP-C  Subject: medication                                       Please clarify: should he be taking spironolactone ? He received a new RX listed from you but the note does not mention it.

## 2023-10-09 ENCOUNTER — Other Ambulatory Visit (INDEPENDENT_AMBULATORY_CARE_PROVIDER_SITE_OTHER): Payer: Self-pay | Admitting: Family

## 2023-10-09 MED ORDER — AMITRIPTYLINE 10 MG TABLET
30.0000 mg | ORAL_TABLET | Freq: Every evening | ORAL | 0 refills | Status: DC
Start: 2023-10-09 — End: 2024-01-14

## 2023-10-23 ENCOUNTER — Other Ambulatory Visit (INDEPENDENT_AMBULATORY_CARE_PROVIDER_SITE_OTHER): Payer: Self-pay | Admitting: Family

## 2023-11-10 ENCOUNTER — Other Ambulatory Visit (INDEPENDENT_AMBULATORY_CARE_PROVIDER_SITE_OTHER): Payer: Self-pay | Admitting: Family

## 2024-01-01 ENCOUNTER — Encounter (INDEPENDENT_AMBULATORY_CARE_PROVIDER_SITE_OTHER): Payer: Self-pay | Admitting: Family

## 2024-01-01 ENCOUNTER — Ambulatory Visit: Payer: Self-pay | Attending: Family | Admitting: Family

## 2024-01-01 ENCOUNTER — Other Ambulatory Visit: Payer: Self-pay

## 2024-01-01 ENCOUNTER — Ambulatory Visit (INDEPENDENT_AMBULATORY_CARE_PROVIDER_SITE_OTHER): Payer: Self-pay | Admitting: Family

## 2024-01-01 VITALS — BP 104/63 | HR 76 | Temp 97.0°F | Ht 70.0 in | Wt 220.0 lb

## 2024-01-01 DIAGNOSIS — F411 Generalized anxiety disorder: Secondary | ICD-10-CM | POA: Insufficient documentation

## 2024-01-01 DIAGNOSIS — I255 Ischemic cardiomyopathy: Secondary | ICD-10-CM | POA: Insufficient documentation

## 2024-01-01 DIAGNOSIS — Z955 Presence of coronary angioplasty implant and graft: Secondary | ICD-10-CM | POA: Insufficient documentation

## 2024-01-01 DIAGNOSIS — K219 Gastro-esophageal reflux disease without esophagitis: Secondary | ICD-10-CM | POA: Insufficient documentation

## 2024-01-01 DIAGNOSIS — Z6831 Body mass index (BMI) 31.0-31.9, adult: Secondary | ICD-10-CM

## 2024-01-01 DIAGNOSIS — N1831 Chronic kidney disease, stage 3a: Secondary | ICD-10-CM | POA: Insufficient documentation

## 2024-01-01 DIAGNOSIS — E538 Deficiency of other specified B group vitamins: Secondary | ICD-10-CM | POA: Insufficient documentation

## 2024-01-01 DIAGNOSIS — I25119 Atherosclerotic heart disease of native coronary artery with unspecified angina pectoris: Secondary | ICD-10-CM | POA: Insufficient documentation

## 2024-01-01 DIAGNOSIS — N411 Chronic prostatitis: Secondary | ICD-10-CM | POA: Insufficient documentation

## 2024-01-01 DIAGNOSIS — I10 Essential (primary) hypertension: Secondary | ICD-10-CM | POA: Insufficient documentation

## 2024-01-01 DIAGNOSIS — Z951 Presence of aortocoronary bypass graft: Secondary | ICD-10-CM | POA: Insufficient documentation

## 2024-01-01 DIAGNOSIS — G8929 Other chronic pain: Secondary | ICD-10-CM | POA: Insufficient documentation

## 2024-01-01 DIAGNOSIS — F17220 Nicotine dependence, chewing tobacco, uncomplicated: Secondary | ICD-10-CM

## 2024-01-01 DIAGNOSIS — G4709 Other insomnia: Secondary | ICD-10-CM | POA: Insufficient documentation

## 2024-01-01 DIAGNOSIS — M8580 Other specified disorders of bone density and structure, unspecified site: Secondary | ICD-10-CM | POA: Insufficient documentation

## 2024-01-01 DIAGNOSIS — E66811 Obesity, class 1: Secondary | ICD-10-CM | POA: Insufficient documentation

## 2024-01-01 DIAGNOSIS — N183 Chronic kidney disease, stage 3 unspecified: Secondary | ICD-10-CM | POA: Insufficient documentation

## 2024-01-01 DIAGNOSIS — M545 Low back pain, unspecified: Secondary | ICD-10-CM | POA: Insufficient documentation

## 2024-01-01 LAB — CBC
HCT: 42.8 % (ref 36.7–47.1)
HGB: 14.5 g/dL (ref 12.5–16.3)
MCH: 31.4 pg (ref 23.8–33.4)
MCHC: 33.8 g/dL (ref 32.5–36.3)
MCV: 92.7 fL (ref 73.0–96.2)
MPV: 8.3 fL (ref 7.4–11.4)
PLATELETS: 153 x10ˆ3/uL (ref 140–440)
RBC: 4.61 x10ˆ6/uL (ref 4.06–5.63)
RDW: 13.9 % (ref 12.1–16.2)
WBC: 7 x10ˆ3/uL (ref 3.6–10.2)

## 2024-01-01 LAB — BASIC METABOLIC PANEL
ANION GAP: 6 mmol/L (ref 4–13)
BUN/CREA RATIO: 13 (ref 6–22)
BUN: 22 mg/dL (ref 7–25)
CALCIUM: 9.6 mg/dL (ref 8.6–10.3)
CHLORIDE: 104 mmol/L (ref 98–107)
CO2 TOTAL: 27 mmol/L (ref 21–31)
CREATININE: 1.64 mg/dL — ABNORMAL HIGH (ref 0.60–1.30)
ESTIMATED GFR: 47 mL/min/1.73mˆ2 — ABNORMAL LOW (ref >59)
GLUCOSE: 71 mg/dL — ABNORMAL LOW (ref 74–109)
OSMOLALITY, CALCULATED: 276 mosm/kg (ref 270–290)
POTASSIUM: 4 mmol/L (ref 3.5–5.1)
SODIUM: 137 mmol/L (ref 136–145)

## 2024-01-01 MED ORDER — ALPRAZOLAM 1 MG TABLET
1.0000 mg | ORAL_TABLET | Freq: Two times a day (BID) | ORAL | 3 refills | Status: DC | PRN
Start: 2024-01-01 — End: 2024-05-02

## 2024-01-01 NOTE — Nursing Note (Signed)
 01/01/24 0900   Recent Weight Change   Have you had a recent unexplained weight loss or gain? N   Domestic Violence   Because we are aware of abuse and domestic violence today, we ask all patients: Are you being hurt, hit, or frightened by anyone at your home or in your life?  N   Basic Needs   Do you have any basic needs within your home that are not being met? (such as Food, Shelter, [employer], Tranportation, paying for bills and/or medications) Y   Help   What is your housing situation? Has Housing   Are you worried about losing your housing? No   What is your current work situation? Unemployed   In the past year, have you or any family members you live with been unable to get any of the following when it was really needed?  No   Has lack of transportation kept you from medical appointments, meetings, work, or from getting things needed for daily living?  No   How often do you see or talk to people that you care about and feel close to? (For example: talking to friends on the phone, visiting friends or family, going to church or club meetings) 3-5x a wk

## 2024-01-01 NOTE — Progress Notes (Signed)
 FAMILY MEDICINE, MEDICAL OFFICE BUILDING  [address]  [place] [address]  Operated by [facility]        Name: Gabriel D Okey Jr. MRN:  [id number]   Date: 01/01/2024 Age: 63 y.o.          Provider: Alan LOISE Mulch, FNP-BC    Reason for visit: Follow Up 4 Months and Labs Only      History of Present Illness:  Gabriel Schranz Durand Mickey. is a 63 y.o. male presenting with     Chronic prostatitis  Urology Nov 2024: added Proscar ; bactrim  for an additional 4 weeks   F/u Feb 2025: completed bactrim  6 week with resolution of pain. Continue Proscar . PSA yearly.      Extensive cardiac history, following with cardiologist.   Cardiac cath July 2023. Chronic DOE.   Has had 3 PCI with total of 9 stents.   ECG at cardio Feb 2024/Aug 2024/Feb 2025  H/o MI and CABG.   H/o blood donation d/t elevated Hgb.   Meds:   NTG PRN- has not had to use since last stent  Aspirin  81 mg daily  Atorvastatin  80 mg daily   Clopidogrel  75 mg daily  Previous Losartan  25 mg daily (failed 50 mg Aug 2024, reports he felt bad), changed Feb 2025  Telmisartan  40 mg daily   Spironolactone  25 mg daily (per cardio)   No BB d/t bradycardia.   Previous:   Lisinopril 5mg  1/2 tab daily and amlodipine : changed to losartan  d/t cardiomyopathy.   Consults: Dr Neomi  Dr Neomi Feb 2025 Continue Plavix , aspirin , increase atorvastatin  to 80 mg (goal LDL < 70), change losartan  25 to telmisartan  40 mg daily. Echo before f/u 6 months.      CKD  Onset: May 2023 or before     Anxiety/insomnia  Onset: age 22s  Aug 2023: long-standing use of alprazolam  d/t h/o anxiety attacks. He ran out recently during change in PCP and had exacerbation of symptoms. I reviewed risks of withdrawals. BOP consistent, no indication of abuse. Denies SI. No SE of meds. No respiratory depression. He waits at least 1 hour between alprazolam  and tramadol when he uses it.   Meds:   Alprazolam  1 mg BID  Citalopram  20 mg daily  Failed melatonin  Failed trazodone  March 2024 (ineffective)    Trial of amitriptyline  July 2024 no help so stopped it. Resume with titration Nov 2024. He took initially but stopped. Retry March 2025.  Consults: none      GERD  Investigations:   No EGD that he is aware of  Colonoscopy Winter 2022  Meds:   Previous Famotidine 20 mg daily PRN  Omeprazole 20 mg daily     Vitamin B12 Def  Med:  Monthly injections (self administers)   Folate      Elevated vitamin D   Lab: > 120 Aug 2023; 96 Nov 2023  PTH 72.21 Jan 2022  Nov 2023: Denies supplement.  Nov 2024: ht loss. Order for BMD.      Osteopenia  BMD Dec 2024 LS 0.0 HP -2.3     Chronic back pain/DDD/bil knee pain  Hx: cervical fusion, bil knee surgery   Reports back XR 2022 at [facility]- requested Nov 2024 (did not receive)  March 2024: may use tramadol twice a month, couple of doses each episode. Declines refill needed.   Med: no longer using tramadol PRN  Was on Lortab previously.   Consults: Dr Cecilie for knee surgery, none since  Dr Fanny for neck/back. Not current.   Does not desire additional work-up/referral March 2025.     Tobacco Use  Nov 2024: did not resume smoking. Smokeless tobacco 1 can q 2-3 days.   March 2024: stopped smoking Dec 2023. Some smokeless tobacco.   Nov 2023: decreased smoking x couple of months, occ dips. Not having a cigarette everyday. Has a dip most days with activity such as walking dog.   Aug 2023: he is motivated to try to quit. Candy helps. He had gotten down to 4-5 cig daily until he ran out of alprazolam  and resumed 1 ppd. Verbalized intent to decrease.   Onset: age 29 Quit x 11 years at age 23 after MI. Restarted age 9 during divorce. Avg 1 PPD. Quit Dec 2023. Approx 34 pack year history.   Declined LDCT July 2024, Nov 2024.     A1C: 5.4  A1C Date: 01/31/2022  COMPLETE BLOOD COUNT   Lab Results   Component Value Date    WBC 7.2 05/01/2023    HGB 15.6 05/01/2023    HCT 45.6 05/01/2023    PLTCNT 147 05/01/2023       DIFFERENTIAL  Lab Results   Component Value Date    PMNS 53 05/01/2023     LYMPHOCYTES 34 05/01/2023    MONOCYTES 10 05/01/2023    EOSINOPHIL 3 05/01/2023    BASOPHILS 1 05/01/2023    BASOPHILS 0.00 05/01/2023    PMNABS 3.80 05/01/2023    LYMPHSABS 2.40 05/01/2023    EOSABS 0.20 05/01/2023    MONOSABS 0.70 05/01/2023     BASIC METABOLIC PANEL  Lab Results   Component Value Date    SODIUM 137 08/30/2023    POTASSIUM 4.2 08/30/2023    CHLORIDE 103 08/30/2023    CO2 25 08/30/2023    ANIONGAP 9 08/30/2023    BUN 17 08/30/2023    CREATININE 1.52 (H) 08/30/2023    BUNCRRATIO 11 08/30/2023    GFR 52 (L) 08/30/2023    CALCIUM 9.2 08/30/2023    GLUCOSE Negative 11/03/2022    GLUCOSENF 96 08/30/2023      LIPID PROFILE  Lab Results   Component Value Date    CHOLESTEROL 137 05/01/2023    HDLCHOL 43 05/01/2023    LDLCHOL 79 05/01/2023    TRIG 75 05/01/2023       LIVER TESTS  Lab Results   Component Value Date    ALBUMIN 4.3 05/01/2023    AST 13 05/01/2023    ALT 9 05/01/2023    ALKPHOS 90 05/01/2023    TOTBILIRUBIN 0.9 05/01/2023    BILIRUBINCON 0.20 (H) 05/01/2023    INR 1.03 01/10/2022     PROSTATE SPECIFIC ANTIGEN  Lab Results   Component Value Date    PROSSPECAG 0.29 05/01/2023       THYROID  STIMULATING HORMONE  Lab Results   Component Value Date    TSH 1.985 05/01/2023     VITAMIN B12  Lab Results   Component Value Date    VITB12 460 05/01/2023     VITAMIN D  25-HYDROXY  Lab Results   Component Value Date    VITD 96 04/27/2022        Historical Data    Past Medical History:  Past Medical History:   Diagnosis Date    Anxiety and depression     Atherosclerotic heart disease of native coronary artery without angina pectoris     Chronic back pain     Chronic prostatitis  Urologist Dr Rico in the past    CKD (chronic kidney disease) stage 3, GFR 30-59 ml/min     Coronary disease     Essential hypertension     GERD (gastroesophageal reflux disease)     High vitamin D  level     Hyperlipidemia     Renal insufficiency     Tobacco abuse     Vitamin B12 deficiency          Past Surgical History:  Past  Surgical History:   Procedure Laterality Date    CARDIAC CATHETERIZATION  06/19/2012    HX CORONARY ARTERY BYPASS GRAFT      HX KNEE SURGERY Bilateral     Dr Cecilie    HX LAP CHOLECYSTECTOMY      NECK SURGERY      Vertebraes fused back together         Allergies:  Allergies[1]  Medications:  Current Outpatient Medications   Medication Sig    ALPRAZolam  (XANAX ) 1 mg Oral Tablet Take 1 Tablet (1 mg total) by mouth Twice per day as needed    amitriptyline  (ELAVIL ) 10 mg Oral Tablet Take 3 Tablets (30 mg total) by mouth Every night for 90 days CALL OFFICE WITH RESULTS    aspirin  (ECOTRIN) 81 mg Oral Tablet, Delayed Release (E.C.) Take 1 Tablet (81 mg total) by mouth Once a day    atorvastatin  (LIPITOR) 80 mg Oral Tablet Take 1 Tablet (80 mg total) by mouth Once a day for cholesterol    citalopram  (CELEXA ) 20 mg Oral Tablet Take 1 tablet by mouth once daily    clopidogreL  (PLAVIX ) 75 mg Oral Tablet Take 1 tablet by mouth once daily    cyanocobalamin  (VITAMIN B12) 1,000 mcg/mL Injection Solution INJECT 1 ML (CC) INTRAMUSCULARLY ONCE EVERY MONTH    finasteride  (PROSCAR ) 5 mg Oral Tablet Take 1 Tablet (5 mg total) by mouth Once a day    folic acid 0.8 mg Oral Capsule Take 1 Capsule (0.8 mg total) by mouth Once a day    nitroGLYCERIN  (NITROSTAT ) 0.4 mg Sublingual Tablet, Sublingual Place 1 Tablet (0.4 mg total) under the tongue Every 5 minutes as needed for Chest pain for 3 doses over 15 minutes (Patient not taking: Reported on 08/16/2023)    omeprazole (PRILOSEC) 20 mg Oral Capsule, Delayed Release(E.C.) Take 1 Capsule (20 mg total) by mouth Once a day    spironolactone  (ALDACTONE ) 25 mg Oral Tablet Take 1 Tablet (25 mg total) by mouth Every morning    telmisartan  (MICARDIS ) 40 mg Oral Tablet Take 1 Tablet (40 mg total) by mouth Once a day     Family History:  Family Medical History:       Problem Relation (Age of Onset)    Cancer Mother    Coronary Artery Disease Father            Social History:  Social History      Socioeconomic History    Marital status: Divorced   Occupational History    Occupation: disabled d/t back   Tobacco Use    Smoking status: Former     Types: Cigarettes    Smokeless tobacco: Current    Tobacco comments:     Nov 2024: 1 can every 2-3 days x 5 months     Quit smoking Dec 2023.   Vaping Use    Vaping status: Never Used   Substance and Sexual Activity    Alcohol use: Yes     Comment: rare social  use only    Drug use: Never     Social Determinants of Health     Financial Resource Strain: Low Risk  (01/01/2024)    Financial Resource Strain     SDOH Financial: No   Transportation Needs: Low Risk  (01/01/2024)    Transportation Needs     SDOH Transportation: No   Social Connections: Medium Risk (01/01/2024)    Social Connections     SDOH Social Isolation: 3 to 5 times a week   Housing Stability: Low Risk  (01/01/2024)    Housing Stability     SDOH Housing Situation: I have housing.     SDOH Housing Worry: No          Physical Exam:  Vital Signs:  Vitals:    01/01/24 0903   BP: 104/63   Pulse: 76   Temp: 36.1 C (97 F)   TempSrc: Temporal   SpO2: 96%   Weight: 99.8 kg (220 lb)   Height: 1.778 m (5' 10)   BMI: 31.57     Physical Exam  Vitals and nursing note reviewed.   Constitutional:       General: He is not in acute distress.     Appearance: Normal appearance.   HENT:      Head: Normocephalic.   Eyes:      General: No scleral icterus.  Neck:      Vascular: No carotid bruit.   Cardiovascular:      Rate and Rhythm: Normal rate and regular rhythm.      Pulses: Normal pulses.      Heart sounds: Normal heart sounds. No murmur heard.  Pulmonary:      Effort: Pulmonary effort is normal. No accessory muscle usage or respiratory distress.      Breath sounds: Normal breath sounds. No wheezing, rhonchi or rales.   Abdominal:      General: Bowel sounds are normal.      Palpations: Abdomen is soft.      Tenderness: There is no abdominal tenderness. There is no guarding.   Musculoskeletal:      Right lower leg: No  edema.      Left lower leg: No edema.   Skin:     General: Skin is warm and dry.      Coloration: Skin is not jaundiced.      Comments: Tan; superficial laceration L lateral knee (reports d/t rock hit while weed eating); laceration R posterior heel (reports hit on cinder block step 2 weeks ago, has used neosporin); no s/s secondary infection to either    Neurological:      General: No focal deficit present.      Mental Status: He is alert.   Psychiatric:         Attention and Perception: Attention normal.         Mood and Affect: Mood and affect normal.         Speech: Speech normal.         Behavior: Behavior normal. Behavior is cooperative.          Assessment:    ICD-10-CM    1. Coronary artery disease involving native coronary artery of native heart with angina pectoris (CMS HCC)  I25.119 CBC     BASIC METABOLIC PANEL      2. Ischemic cardiomyopathy  I25.5 CBC     BASIC METABOLIC PANEL      3. Hx of CABG  Z95.1  4. History of heart artery stent  Z95.5       5. Stage 3a chronic kidney disease (CMS HCC)  N18.31       6. Essential (primary) hypertension  I10       7. Chronic prostatitis  N41.1       8. Gastroesophageal reflux disease, unspecified whether esophagitis present  K21.9       9. GAD (generalized anxiety disorder)  F41.1       10. Other insomnia  G47.09       11. Vitamin B12 deficiency  E53.8       12. Chronic low back pain, unspecified back pain laterality, unspecified whether sciatica present  M54.50     G89.29       13. Osteopenia, unspecified location  M85.80       14. Obesity (BMI 30.0-34.9)  E66.811       15. Stage 3 chronic kidney disease, unspecified whether stage 3a or 3b CKD (CMS HCC)  N18.30 CBC     BASIC METABOLIC PANEL           Plan:  Orders Placed This Encounter    CBC    BASIC METABOLIC PANEL    ALPRAZolam  (XANAX ) 1 mg Oral Tablet     Chronic medical conditions reviewed and updated per HPI.  Reviewed previous labs and additional ordered.  CAD/cardiomyopathy/history of CABG/history of  stent: No acute symptoms.  Denies chest pain.  He has not needed the nitroglycerin  but reports he does still have.  Has upcoming echocardiogram.  Reports overall doing well just has lack of motivation.  Chronic kidney disease: No acute symptoms.  Lab has been stable.  Hypertension: BP well-controlled to goal.  Continue current medications.  Chronic prostatitis: Denies urinary symptoms.  Gastroesophageal reflux disease: Symptoms are well-controlled with current medication, continue.  Denies heartburn with medication.  GAD: Symptoms are well-controlled with the alprazolam , continue.  No indication of abuse.  Vitamin B12 deficiency: Well-controlled with current supplement, continue.  Low back pain: Symptoms are stable.  Obesity: Has had 6 lb weight gain in the last 4 months.       Return in about 4 months (around 05/02/2024) for add CDM same day as AWV already scheduled .    Alan LOISE Mulch, FNP-BC     Portions of this note may be dictated using voice recognition software or a dictation service. Variances in spelling and vocabulary are possible and unintentional. Not all errors are caught/corrected. Please notify the dino if any discrepancies are noted or if the meaning of any statement is not clear.          [1] No Known Allergies

## 2024-01-01 NOTE — Patient Instructions (Signed)
 You may try hydrogel or wicking dressing on right heel.

## 2024-01-13 ENCOUNTER — Other Ambulatory Visit (INDEPENDENT_AMBULATORY_CARE_PROVIDER_SITE_OTHER): Payer: Self-pay | Admitting: Family

## 2024-01-14 ENCOUNTER — Ambulatory Visit
Admission: RE | Admit: 2024-01-14 | Discharge: 2024-01-14 | Disposition: A | Discharge status: Home / Self Care | Payer: Self-pay | Source: Ambulatory Visit | Attending: NURSE PRACTITIONER | Admitting: NURSE PRACTITIONER

## 2024-01-14 ENCOUNTER — Other Ambulatory Visit: Payer: Self-pay

## 2024-01-14 DIAGNOSIS — I10 Essential (primary) hypertension: Secondary | ICD-10-CM | POA: Insufficient documentation

## 2024-01-14 DIAGNOSIS — I255 Ischemic cardiomyopathy: Secondary | ICD-10-CM | POA: Insufficient documentation

## 2024-01-14 LAB — TRANSTHORACIC ECHOCARDIOGRAM - ADULT
EF VISUAL ESTIMATE: 45
EF: 50

## 2024-01-14 MED ORDER — SODIUM CHLORIDE 0.9 % INJECTION SOLUTION
2.0000 mL | Freq: Once | INTRAVENOUS | Status: DC | PRN
Start: 2024-01-14 — End: 2024-01-15
  Administered 2024-01-14: 5 mL via INTRAVENOUS

## 2024-01-18 ENCOUNTER — Encounter (INDEPENDENT_AMBULATORY_CARE_PROVIDER_SITE_OTHER): Payer: Self-pay

## 2024-01-20 ENCOUNTER — Other Ambulatory Visit (INDEPENDENT_AMBULATORY_CARE_PROVIDER_SITE_OTHER): Payer: Self-pay | Admitting: Family

## 2024-02-12 ENCOUNTER — Other Ambulatory Visit: Payer: Self-pay

## 2024-02-12 ENCOUNTER — Encounter (INDEPENDENT_AMBULATORY_CARE_PROVIDER_SITE_OTHER): Payer: Self-pay | Admitting: INTERVENTIONAL CARDIOLOGY

## 2024-02-12 ENCOUNTER — Ambulatory Visit: Payer: Self-pay | Attending: INTERVENTIONAL CARDIOLOGY | Admitting: INTERVENTIONAL CARDIOLOGY

## 2024-02-12 VITALS — BP 118/72 | HR 95 | Ht 70.0 in | Wt 220.0 lb

## 2024-02-12 DIAGNOSIS — I2510 Atherosclerotic heart disease of native coronary artery without angina pectoris: Secondary | ICD-10-CM

## 2024-02-12 DIAGNOSIS — I5022 Chronic systolic (congestive) heart failure: Secondary | ICD-10-CM | POA: Insufficient documentation

## 2024-02-12 DIAGNOSIS — Z720 Tobacco use: Secondary | ICD-10-CM

## 2024-02-12 DIAGNOSIS — E785 Hyperlipidemia, unspecified: Secondary | ICD-10-CM

## 2024-02-12 DIAGNOSIS — I255 Ischemic cardiomyopathy: Secondary | ICD-10-CM

## 2024-02-12 DIAGNOSIS — I10 Essential (primary) hypertension: Secondary | ICD-10-CM

## 2024-02-12 DIAGNOSIS — I351 Nonrheumatic aortic (valve) insufficiency: Secondary | ICD-10-CM

## 2024-02-12 MED ORDER — METOPROLOL SUCCINATE ER 25 MG TABLET,EXTENDED RELEASE 24 HR
25.0000 mg | ORAL_TABLET | Freq: Every day | ORAL | 4 refills | Status: AC
Start: 2024-02-12 — End: ?

## 2024-02-12 NOTE — Progress Notes (Addendum)
 [facility] Cardiology [facility]     Gabriel Best, Gabriel D Jr., 63 y.o. male  Date of Service: 02/12/2024  Date of Birth:  [birth year]  PCP:  Alan LOISE Mulch, FNP-BC  Chief Complaint   Patient presents with    Heart Disease    Hypertension    Hyperlipidemia    Follow Up 6 Months        HPI:    The patient has a history of CAD.  He had four-vessel bypass at age 34 after MI performed at [facility].  LV function was preserved.  In 2013, the patient had a non-ST elevation MI and underwent cardiac catheterization by Dr. Winifred at [facility] in [place].  He was found to have an occlusion in the vein graft to the left circumflex, which was borderline stenosis 60% to 70%.  There was 75% stenosis in the mid RCA that had a patent vein graft and there was also patent graft to the diagonal.  Stress test in 2018 for preoperative assessment was low risk study.  EF was 64% on resting imaging.  There was no TID.  There was a moderate size apical perfusion defect that was largely fixed consistent with previous defect just slightly extended.  Repeat stress test in May 2022 was a normal, low risk study, normal myocardial perfusion imaging.  EF was slightly reduced to 46%. Echocardiogram in March 2022 showed EF 45% with moderate aortic insufficiency. The patient underwent cath on 11/23/21 which resulted in PCI to the RCA, followed by staged PCI to the PLB and distal RCA on 12/13/21, and then staged PCI to the SVG to the diagonal as well as to the left main/LCX/OM2.  Chest pain did improve with initial PCI to the RCA. Echo 08/17/21 showed EF 45% with moderate AI.     08/16/2023 The patient is here for coronary artery disease follow-up.  He denies any chest pain.  He does have dyspnea with heavy exertion, unchanged.  He has stopped smoking.  Home blood pressure readings have been about 118-130.  He is now on Medicare, his prescription costs continue to be challenging however he reports compliance.    02/12/24 The patient is  here for CAD f/u.  Repeat echo 01/14/2024 showed EF 45-50% with hypokinetic anterior and septal walls, mild AS, mild AR. He denies any significant chest pains or sob.  He has some baseline sob with exertion that is unchanged and not limiting.  His heart rate is in the 90's today, he states it runs usually in the 80-90's at home.  He is not on beta blocker, it was held in the past due to bradycardia.     EKG:  Normal sinus rhythm, LVH, rate 96  LAB:     Lab Results   Component Value Date    CHOLESTEROL 137 05/01/2023    HDLCHOL 43 05/01/2023    LDLCHOL 79 05/01/2023    TRIG 75 05/01/2023       Lab Results   Component Value Date    HA1C 5.4 01/31/2022      Last BMP  (Last result in the past 2 years)        Na   K   Cl   CO2   BUN   Cr   Calcium   Glucose   Glucose-Fasting        01/01/24 0929 137   4.0   104   27   22   1.64   9.6   71  Past Medical History:   Diagnosis Date    Anxiety and depression     Atherosclerotic heart disease of native coronary artery without angina pectoris     Chronic back pain     Chronic prostatitis     Urologist Dr Rico in the past    CKD (chronic kidney disease) stage 3, GFR 30-59 ml/min     Coronary disease     Essential hypertension     GERD (gastroesophageal reflux disease)     High vitamin D  level     Hyperlipidemia     Renal insufficiency     Tobacco abuse     Vitamin B12 deficiency        Past Surgical History:   Procedure Laterality Date    CARDIAC CATHETERIZATION  06/19/2012    HX CORONARY ARTERY BYPASS GRAFT      HX KNEE SURGERY Bilateral     Dr Cecilie    HX LAP CHOLECYSTECTOMY      NECK SURGERY      Vertebraes fused back together       Current Outpatient Medications   Medication Sig    ALPRAZolam  (XANAX ) 1 mg Oral Tablet Take 1 Tablet (1 mg total) by mouth Twice per day as needed    amitriptyline  (ELAVIL ) 10 mg Oral Tablet TAKE 3 TABLETS BY MOUTH ONCE DAILY AT NIGHT    aspirin  (ECOTRIN) 81 mg Oral Tablet, Delayed Release (E.C.) Take 1 Tablet (81 mg total) by  mouth Once a day    atorvastatin  (LIPITOR) 80 mg Oral Tablet Take 1 Tablet (80 mg total) by mouth Once a day for cholesterol    citalopram  (CELEXA ) 20 mg Oral Tablet Take 1 tablet by mouth once daily    clopidogreL  (PLAVIX ) 75 mg Oral Tablet Take 1 tablet by mouth once daily    cyanocobalamin  (VITAMIN B12) 1,000 mcg/mL Injection Solution INJECT 1 ML (CC) INTRAMUSCULARLY ONCE EVERY MONTH    finasteride  (PROSCAR ) 5 mg Oral Tablet Take 1 Tablet (5 mg total) by mouth Once a day    folic acid 0.8 mg Oral Capsule Take 1 Capsule (0.8 mg total) by mouth Once a day    metoprolol  succinate (TOPROL -XL) 25 mg Oral Tablet Sustained Release 24 hr Take 1 Tablet (25 mg total) by mouth Daily    nitroGLYCERIN  (NITROSTAT ) 0.4 mg Sublingual Tablet, Sublingual Place 1 Tablet (0.4 mg total) under the tongue Every 5 minutes as needed for Chest pain for 3 doses over 15 minutes    omeprazole (PRILOSEC) 20 mg Oral Capsule, Delayed Release(E.C.) Take 1 Capsule (20 mg total) by mouth Once a day    spironolactone  (ALDACTONE ) 25 mg Oral Tablet Take 1 Tablet (25 mg total) by mouth Every morning    telmisartan  (MICARDIS ) 40 mg Oral Tablet Take 1 Tablet (40 mg total) by mouth Once a day     ROS: Other than issues noted in HPI, all other systems were negative.     Exam:  Vitals:    02/12/24 0956   BP: 118/72   Pulse: 95   SpO2: 96%   Weight: 99.8 kg (220 lb)   Height: 1.778 m (5' 10)   BMI: 31.57       General: No acute distress and appears stated age.    HEENT:Head normocephalic, atraumatic. ENT without erythema or injection, mucouse membranes moist.    Neck: No JVD, no carotid bruit. and supple, symmetrical, trachea midline.   Lungs: Clear to auscultation bilaterally.    Cardiovascular: Regular rate  and rhythm, normal S1 S2, no murmur, no rub, or gallop, no thrill     Abdomen: Soft, non-tender and bowel sounds normal.    Extremities: Extremities normal, atraumatic, no cyanosis or edema.    Skin: Skin warm and dry.    Neurologic: Alert and  oriented x3.  Psych: Mood and affect congruent for age and gender     Orders placed this visit:  Orders Placed This Encounter    EKG (In-Clinic Today)    metoprolol  succinate (TOPROL -XL) 25 mg Oral Tablet Sustained Release 24 hr       Assessment/Plan:  Atherosclerotic heart disease of native coronary artery without angina pectoris    Ischemic cardiomyopathy    Nonrheumatic aortic valve insufficiency    Essential hypertension    Hyperlipidemia with target low density lipoprotein (LDL) cholesterol less than 70 mg/dL    Tobacco use    Chronic HFrEF (heart failure with reduced ejection fraction) (CMS HCC)    Continue aspirin  81 mg daily   Continue atorvastatin  80 mg daily   Continue Plavix  75 mg daily   Continue Aldactone  25 mg daily   Continue telmisartan  40 mg daily  Start Toprol  XL 25 mg daily    Monitor blood pressure and heart rate at home with addition of Toprol .  SOB is stable.  Continue above medications. Return in 6 months for routine f/u.     Leandrew JINNY Batty, APRN,FNP-BC 02/12/2024 10:56    Interventional Cardiology attending: The patient was seen by me and examined as part of a shared service with the APP. The patient was clinically evaluated, and all pertinent lab results and imaging were reviewed. I reviewed the APP note and made any substantive changes necessary. I agree with the APP note, which reflects my medical decision making. CAD, HTN, HL with good control. Atorvastatin  recently increased. Awaiting labs after this change. Add Toprol  in setting of ischemic cardiomyopathy/HFrEF. Continue other meds. RTC 6 months.    Garnette Sink, MD  02/12/2024 10:56

## 2024-02-13 LAB — ECG W INTERP (AMB USE ONLY)(MUSE,IN CLINIC)
Atrial Rate: 96 {beats}/min
Calculated P Axis: 30 degrees
Calculated R Axis: 1 degrees
Calculated T Axis: 78 degrees
PR Interval: 168 ms
QRS Duration: 90 ms
QT Interval: 346 ms
QTC Calculation: 437 ms
Ventricular rate: 96 {beats}/min

## 2024-02-15 ENCOUNTER — Encounter (INDEPENDENT_AMBULATORY_CARE_PROVIDER_SITE_OTHER): Payer: Self-pay | Admitting: Physician Assistant

## 2024-02-15 ENCOUNTER — Other Ambulatory Visit: Payer: Self-pay

## 2024-02-15 ENCOUNTER — Ambulatory Visit (INDEPENDENT_AMBULATORY_CARE_PROVIDER_SITE_OTHER): Payer: Self-pay | Admitting: Physician Assistant

## 2024-02-15 VITALS — BP 97/57 | HR 74 | Ht 70.0 in | Wt 224.0 lb

## 2024-02-15 DIAGNOSIS — N401 Enlarged prostate with lower urinary tract symptoms: Secondary | ICD-10-CM

## 2024-02-15 DIAGNOSIS — Z87438 Personal history of other diseases of male genital organs: Secondary | ICD-10-CM

## 2024-02-15 MED ORDER — FINASTERIDE 5 MG TABLET
5.0000 mg | ORAL_TABLET | Freq: Every day | ORAL | 3 refills | Status: AC
Start: 2024-02-15 — End: ?

## 2024-02-15 NOTE — Progress Notes (Signed)
 UROLOGY, [facility]  [address] [place] [address]    Progress Note    Name: Gabriel Best. MRN:  [id number]   Date: 02/15/2024 DOB:  August 31, 1960 (63 y.o.)             Chief Complaint: Follow Up (BPH/LUTZ)  Subjective   Subjective:   Gabriel JONETTA Gronau Mickey. is a pleasant 63 y.o. White male whom presents to the clinic for prostatitis.  Patient recently completed 2 week course of Bactrim  b.i.d. Patient reports several episodes of prostatitis over the last 10 years or so which resolve with 2-3 week course of antibiotics. Patient with moderate urine stream with mild hesitancy. Nocturia x 2. Patient denies personal or family history of urinary malignancy.Patient reports occupational chemical exposure.Patient denies 30 pack year history of tobacco use; quit 1 year ago.     Today, he presents for follow up. He reports no difficulty with urination. No symptoms of prostatitis in interval. He denies fever, chills, suprapubic pain, nausea, vomiting, flank pain, dysuria, or hematuria.         PROSTATE SPECIFIC ANTIGEN   Lab Results   Component Value Date/Time    PROSSPECAG 0.29 05/01/2023 10:09 AM    PROSSPECAG 0.28 01/31/2022 08:07 AM                Objective   Objective :  BP (!) 97/57   Pulse 74   Ht 1.778 m (5' 10)   Wt 102 kg (224 lb)   BMI 32.14 kg/m       Gen: NAD, alert  Pulm: unlabored at rest  CV: palpable pulses  Abd: soft, Nt/ND  GU: no suprapubic tenderness, no CVAT    Data reviewed:    Current Outpatient Medications   Medication Sig    ALPRAZolam  (XANAX ) 1 mg Oral Tablet Take 1 Tablet (1 mg total) by mouth Twice per day as needed    amitriptyline  (ELAVIL ) 10 mg Oral Tablet TAKE 3 TABLETS BY MOUTH ONCE DAILY AT NIGHT    aspirin  (ECOTRIN) 81 mg Oral Tablet, Delayed Release (E.C.) Take 1 Tablet (81 mg total) by mouth Once a day    atorvastatin  (LIPITOR) 80 mg Oral Tablet Take 1 Tablet (80 mg total) by mouth Once a day for cholesterol    citalopram  (CELEXA ) 20 mg Oral Tablet Take 1 tablet  by mouth once daily    clopidogreL  (PLAVIX ) 75 mg Oral Tablet Take 1 tablet by mouth once daily    cyanocobalamin  (VITAMIN B12) 1,000 mcg/mL Injection Solution INJECT 1 ML (CC) INTRAMUSCULARLY ONCE EVERY MONTH    finasteride  (PROSCAR ) 5 mg Oral Tablet Take 1 Tablet (5 mg total) by mouth Once a day    folic acid 0.8 mg Oral Capsule Take 1 Capsule (0.8 mg total) by mouth Once a day    metoprolol  succinate (TOPROL -XL) 25 mg Oral Tablet Sustained Release 24 hr Take 1 Tablet (25 mg total) by mouth Daily    nitroGLYCERIN  (NITROSTAT ) 0.4 mg Sublingual Tablet, Sublingual Place 1 Tablet (0.4 mg total) under the tongue Every 5 minutes as needed for Chest pain for 3 doses over 15 minutes    omeprazole (PRILOSEC) 20 mg Oral Capsule, Delayed Release(E.C.) Take 1 Capsule (20 mg total) by mouth Once a day    spironolactone  (ALDACTONE ) 25 mg Oral Tablet Take 1 Tablet (25 mg total) by mouth Every morning    telmisartan  (MICARDIS ) 40 mg Oral Tablet Take 1 Tablet (40 mg total) by mouth  Once a day        Assessment/Plan  Problem List Items Addressed This Visit    None      BPH/LUTS  Discussed the differential diagnosis, pathophysiology and nature of benign prostate enlargement causing lower urinary tract symptoms  Counseled patient on conservative management options including appropriate fluid management, avoidance of diuretics including caffeine and alcohol      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 Tamsulosin) if voiding-predominant symptoms  Anti-inflammatory/anti-oxidant agents  including non-steroidal antiinflammatories (NSAIDS), Quercetin (a bioflavonoid supplement) and/or Finasteride  (Proscar )  Daily warm sitz baths  Avoidance of foods that may trigger symptoms such as caffeine, spicy foods and/or alcohol  Stress management  We also discussed the role of further diagnostic modalities, including cystoscopy, scrotal US , CT abd/pel to aid in the diagnosis and rule out alternative diagnoses.  Refill Finasteride  (ProscarT) 5 mg P.O. daily:          Prostate Cancer Screening  PSA yearly      Kashmere Daywalt, PA-C

## 2024-03-16 ENCOUNTER — Other Ambulatory Visit (INDEPENDENT_AMBULATORY_CARE_PROVIDER_SITE_OTHER): Payer: Self-pay | Admitting: NURSE PRACTITIONER

## 2024-03-26 ENCOUNTER — Other Ambulatory Visit (INDEPENDENT_AMBULATORY_CARE_PROVIDER_SITE_OTHER): Payer: Self-pay

## 2024-04-03 ENCOUNTER — Other Ambulatory Visit (INDEPENDENT_AMBULATORY_CARE_PROVIDER_SITE_OTHER): Payer: Self-pay | Admitting: Family

## 2024-04-11 ENCOUNTER — Other Ambulatory Visit (INDEPENDENT_AMBULATORY_CARE_PROVIDER_SITE_OTHER): Payer: Self-pay | Admitting: Family

## 2024-04-17 ENCOUNTER — Other Ambulatory Visit (INDEPENDENT_AMBULATORY_CARE_PROVIDER_SITE_OTHER): Payer: Self-pay | Admitting: Family

## 2024-04-17 MED ORDER — CITALOPRAM 20 MG TABLET
20.0000 mg | ORAL_TABLET | Freq: Every day | ORAL | 0 refills | Status: DC
Start: 2024-04-17 — End: 2024-05-02

## 2024-05-02 ENCOUNTER — Ambulatory Visit (INDEPENDENT_AMBULATORY_CARE_PROVIDER_SITE_OTHER): Payer: Self-pay | Admitting: Family

## 2024-05-02 ENCOUNTER — Encounter (INDEPENDENT_AMBULATORY_CARE_PROVIDER_SITE_OTHER): Payer: Self-pay | Admitting: Family

## 2024-05-02 ENCOUNTER — Ambulatory Visit (INDEPENDENT_AMBULATORY_CARE_PROVIDER_SITE_OTHER): Payer: Self-pay | Admitting: Family Medicine

## 2024-05-02 ENCOUNTER — Ambulatory Visit: Payer: Medicare Other | Attending: Family | Admitting: Family

## 2024-05-02 ENCOUNTER — Other Ambulatory Visit: Payer: Self-pay

## 2024-05-02 VITALS — BP 98/62 | HR 86 | Temp 97.5°F | Ht 70.5 in | Wt 222.0 lb

## 2024-05-02 DIAGNOSIS — F411 Generalized anxiety disorder: Secondary | ICD-10-CM | POA: Insufficient documentation

## 2024-05-02 DIAGNOSIS — G4709 Other insomnia: Secondary | ICD-10-CM | POA: Insufficient documentation

## 2024-05-02 DIAGNOSIS — M4802 Spinal stenosis, cervical region: Secondary | ICD-10-CM

## 2024-05-02 DIAGNOSIS — Z6831 Body mass index (BMI) 31.0-31.9, adult: Secondary | ICD-10-CM

## 2024-05-02 DIAGNOSIS — Z955 Presence of coronary angioplasty implant and graft: Secondary | ICD-10-CM

## 2024-05-02 DIAGNOSIS — N1831 Chronic kidney disease, stage 3a: Secondary | ICD-10-CM | POA: Insufficient documentation

## 2024-05-02 DIAGNOSIS — Z125 Encounter for screening for malignant neoplasm of prostate: Secondary | ICD-10-CM | POA: Insufficient documentation

## 2024-05-02 DIAGNOSIS — I129 Hypertensive chronic kidney disease with stage 1 through stage 4 chronic kidney disease, or unspecified chronic kidney disease: Secondary | ICD-10-CM | POA: Insufficient documentation

## 2024-05-02 DIAGNOSIS — N411 Chronic prostatitis: Secondary | ICD-10-CM | POA: Insufficient documentation

## 2024-05-02 DIAGNOSIS — M8580 Other specified disorders of bone density and structure, unspecified site: Secondary | ICD-10-CM | POA: Insufficient documentation

## 2024-05-02 DIAGNOSIS — I255 Ischemic cardiomyopathy: Secondary | ICD-10-CM | POA: Insufficient documentation

## 2024-05-02 DIAGNOSIS — Z860100 Personal history of colon polyps, unspecified: Secondary | ICD-10-CM | POA: Insufficient documentation

## 2024-05-02 DIAGNOSIS — E66811 Obesity, class 1: Secondary | ICD-10-CM

## 2024-05-02 DIAGNOSIS — Z131 Encounter for screening for diabetes mellitus: Secondary | ICD-10-CM

## 2024-05-02 DIAGNOSIS — M545 Low back pain, unspecified: Secondary | ICD-10-CM | POA: Insufficient documentation

## 2024-05-02 DIAGNOSIS — K219 Gastro-esophageal reflux disease without esophagitis: Secondary | ICD-10-CM

## 2024-05-02 DIAGNOSIS — I10 Essential (primary) hypertension: Secondary | ICD-10-CM | POA: Insufficient documentation

## 2024-05-02 DIAGNOSIS — E538 Deficiency of other specified B group vitamins: Secondary | ICD-10-CM | POA: Insufficient documentation

## 2024-05-02 DIAGNOSIS — Z951 Presence of aortocoronary bypass graft: Secondary | ICD-10-CM

## 2024-05-02 DIAGNOSIS — G8929 Other chronic pain: Secondary | ICD-10-CM

## 2024-05-02 DIAGNOSIS — I25119 Atherosclerotic heart disease of native coronary artery with unspecified angina pectoris: Secondary | ICD-10-CM | POA: Insufficient documentation

## 2024-05-02 DIAGNOSIS — Z0000 Encounter for general adult medical examination without abnormal findings: Secondary | ICD-10-CM | POA: Insufficient documentation

## 2024-05-02 DIAGNOSIS — Z23 Encounter for immunization: Secondary | ICD-10-CM | POA: Insufficient documentation

## 2024-05-02 LAB — BASIC METABOLIC PANEL
ANION GAP: 7 mmol/L (ref 4–13)
BUN/CREA RATIO: 18 (ref 6–22)
BUN: 27 mg/dL — ABNORMAL HIGH (ref 7–25)
CALCIUM: 9.1 mg/dL (ref 8.6–10.3)
CHLORIDE: 106 mmol/L (ref 98–107)
CO2 TOTAL: 24 mmol/L (ref 21–31)
CREATININE: 1.51 mg/dL — ABNORMAL HIGH (ref 0.60–1.30)
ESTIMATED GFR: 52 mL/min/1.73mˆ2 — ABNORMAL LOW (ref >59)
GLUCOSE: 107 mg/dL (ref 74–109)
OSMOLALITY, CALCULATED: 280 mosm/kg (ref 270–290)
POTASSIUM: 4 mmol/L (ref 3.5–5.1)
SODIUM: 137 mmol/L (ref 136–145)

## 2024-05-02 LAB — HGA1C (HEMOGLOBIN A1C WITH EST AVG GLUCOSE): HEMOGLOBIN A1C: 5.7 % (ref 4.0–6.0)

## 2024-05-02 LAB — LIPID PANEL
CHOL/HDL RATIO: 2.4
CHOLESTEROL: 112 mg/dL (ref <200)
HDL CHOL: 46 mg/dL (ref >=40)
LDL CALC: 52 mg/dL (ref 0–100)
TRIGLYCERIDES: 69 mg/dL (ref <=150)
VLDL CALC: 14 mg/dL (ref 0–50)

## 2024-05-02 LAB — HEPATIC FUNCTION PANEL
ALBUMIN/GLOBULIN RATIO: 1.4 (ref 0.8–1.4)
ALBUMIN: 4 g/dL (ref 3.5–5.7)
ALKALINE PHOSPHATASE: 84 U/L (ref 34–104)
ALT (SGPT): 9 U/L (ref 7–52)
AST (SGOT): 12 U/L — ABNORMAL LOW (ref 13–39)
BILIRUBIN DIRECT: 0.18 md/dL (ref 0.03–0.18)
BILIRUBIN TOTAL: 0.7 mg/dL (ref 0.3–1.0)
BILIRUBIN, INDIRECT: 0.52 mg/dL (ref <=1)
GLOBULIN: 2.8 (ref 2.0–3.5)
PROTEIN TOTAL: 6.8 g/dL (ref 6.4–8.9)

## 2024-05-02 LAB — VITAMIN B12: VITAMIN B 12: 280 pg/mL (ref 180–914)

## 2024-05-02 LAB — THYROID STIMULATING HORMONE (SENSITIVE TSH): TSH: 1.876 u[IU]/mL (ref 0.450–5.330)

## 2024-05-02 LAB — VITAMIN D 25 TOTAL: VITAMIN D 25, TOTAL: 65.8 ng/mL (ref 30.00–100.00)

## 2024-05-02 LAB — PSA SCREENING: PSA: 0.03 ng/mL (ref <=4.00)

## 2024-05-02 MED ORDER — ALPRAZOLAM 1 MG TABLET
1.0000 mg | ORAL_TABLET | Freq: Two times a day (BID) | ORAL | 3 refills | Status: AC | PRN
Start: 2024-05-02 — End: ?

## 2024-05-02 MED ORDER — CITALOPRAM 20 MG TABLET
20.0000 mg | ORAL_TABLET | Freq: Every day | ORAL | 0 refills | Status: AC
Start: 2024-05-02 — End: ?

## 2024-05-02 MED ORDER — CYANOCOBALAMIN (VIT B-12) 1,000 MCG/ML INJECTION SOLUTION
1000.0000 ug | INTRAMUSCULAR | 1 refills | Status: AC
Start: 2024-05-02 — End: ?

## 2024-05-02 MED ORDER — AMITRIPTYLINE 10 MG TABLET
30.0000 mg | ORAL_TABLET | Freq: Every evening | ORAL | 0 refills | Status: AC
Start: 2024-05-02 — End: ?

## 2024-08-12 ENCOUNTER — Ambulatory Visit (INDEPENDENT_AMBULATORY_CARE_PROVIDER_SITE_OTHER): Payer: Self-pay | Admitting: INTERVENTIONAL CARDIOLOGY

## 2024-09-01 ENCOUNTER — Ambulatory Visit (INDEPENDENT_AMBULATORY_CARE_PROVIDER_SITE_OTHER): Payer: Self-pay | Admitting: Family

## 2025-02-13 ENCOUNTER — Encounter (INDEPENDENT_AMBULATORY_CARE_PROVIDER_SITE_OTHER): Payer: Self-pay | Admitting: Physician Assistant

## 2025-05-08 ENCOUNTER — Ambulatory Visit (INDEPENDENT_AMBULATORY_CARE_PROVIDER_SITE_OTHER): Payer: Self-pay | Admitting: Family
